# Patient Record
Sex: Male | Born: 1940 | Race: White | Hispanic: No | Marital: Married | Smoking: Former smoker
Health system: Southern US, Community
[De-identification: ages and names within clinical notes are randomized; demographics above are authoritative.]

## PROBLEM LIST (undated history)

## (undated) DIAGNOSIS — F0390 Unspecified dementia without behavioral disturbance: Secondary | ICD-10-CM

## (undated) HISTORY — PX: SKIN GRAFT: SHX250

## (undated) HISTORY — PX: OTHER SURGICAL HISTORY: SHX169

## (undated) HISTORY — PX: MOUTH SURGERY: SHX715

---

## 1999-10-24 ENCOUNTER — Encounter: Admission: RE | Admit: 1999-10-24 | Discharge: 1999-10-24 | Payer: Self-pay | Admitting: *Deleted

## 1999-10-24 ENCOUNTER — Encounter: Payer: Self-pay | Admitting: *Deleted

## 2001-07-23 ENCOUNTER — Ambulatory Visit (HOSPITAL_COMMUNITY): Admission: RE | Admit: 2001-07-23 | Discharge: 2001-07-23 | Payer: Self-pay | Admitting: Gastroenterology

## 2001-07-23 ENCOUNTER — Encounter: Payer: Self-pay | Admitting: Gastroenterology

## 2015-11-14 ENCOUNTER — Encounter: Payer: Self-pay | Admitting: Nurse Practitioner

## 2016-02-22 ENCOUNTER — Encounter: Payer: Self-pay | Admitting: Family Medicine

## 2016-02-22 ENCOUNTER — Ambulatory Visit (INDEPENDENT_AMBULATORY_CARE_PROVIDER_SITE_OTHER): Payer: Medicare Other | Admitting: Family Medicine

## 2016-02-22 VITALS — BP 144/100 | HR 68 | Temp 98.3°F | Resp 16 | Ht 70.0 in | Wt 228.0 lb

## 2016-02-22 DIAGNOSIS — R413 Other amnesia: Secondary | ICD-10-CM | POA: Diagnosis not present

## 2016-02-22 DIAGNOSIS — R4701 Aphasia: Secondary | ICD-10-CM

## 2016-02-22 LAB — COMPLETE METABOLIC PANEL WITH GFR
ALT: 13 U/L (ref 9–46)
AST: 14 U/L (ref 10–35)
Albumin: 4.1 g/dL (ref 3.6–5.1)
Alkaline Phosphatase: 56 U/L (ref 40–115)
BUN: 21 mg/dL (ref 7–25)
CO2: 24 mmol/L (ref 20–31)
Calcium: 8.7 mg/dL (ref 8.6–10.3)
Chloride: 105 mmol/L (ref 98–110)
Creat: 1.47 mg/dL — ABNORMAL HIGH (ref 0.70–1.18)
GFR, Est African American: 53 mL/min — ABNORMAL LOW (ref >=60)
GFR, Est Non African American: 46 mL/min — ABNORMAL LOW (ref >=60)
Glucose, Bld: 96 mg/dL (ref 70–99)
Potassium: 4.4 mmol/L (ref 3.5–5.3)
Sodium: 141 mmol/L (ref 135–146)
Total Bilirubin: 0.5 mg/dL (ref 0.2–1.2)
Total Protein: 7.3 g/dL (ref 6.1–8.1)

## 2016-02-22 LAB — CBC WITH DIFFERENTIAL/PLATELET
Basophils Absolute: 65 cells/uL (ref 0–200)
Basophils Relative: 1 %
Eosinophils Absolute: 325 cells/uL (ref 15–500)
Eosinophils Relative: 5 %
HCT: 44.9 % (ref 38.5–50.0)
Hemoglobin: 15.2 g/dL (ref 13.0–17.0)
Lymphocytes Relative: 29 %
Lymphs Abs: 1885 cells/uL (ref 850–3900)
MCH: 29.5 pg (ref 27.0–33.0)
MCHC: 33.9 g/dL (ref 32.0–36.0)
MCV: 87.2 fL (ref 80.0–100.0)
MPV: 10.1 fL (ref 7.5–12.5)
Monocytes Absolute: 780 cells/uL (ref 200–950)
Monocytes Relative: 12 %
Neutro Abs: 3445 cells/uL (ref 1500–7800)
Neutrophils Relative %: 53 %
Platelets: 230 10*3/uL (ref 140–400)
RBC: 5.15 MIL/uL (ref 4.20–5.80)
RDW: 14.2 % (ref 11.0–15.0)
WBC: 6.5 10*3/uL (ref 3.8–10.8)

## 2016-02-22 LAB — LIPID PANEL
CHOL/HDL RATIO: 6.2 ratio — AB (ref <=5.0)
Cholesterol: 193 mg/dL (ref 125–200)
HDL: 31 mg/dL — AB (ref >=40)
LDL CALC: 107 mg/dL (ref <130)
TRIGLYCERIDES: 273 mg/dL — AB (ref <150)
VLDL: 55 mg/dL — AB (ref <30)

## 2016-02-22 LAB — VITAMIN B12: Vitamin B-12: 433 pg/mL (ref 200–1100)

## 2016-02-22 LAB — TSH: TSH: 1.85 m[IU]/L (ref 0.40–4.50)

## 2016-02-22 MED ORDER — DONEPEZIL HCL 10 MG PO TABS: 10.0000 mg | ORAL_TABLET | Freq: Every day | ORAL | 5 refills | Status: DC

## 2016-02-22 MED ORDER — MEMANTINE HCL 10 MG PO TABS: 10.0000 mg | ORAL_TABLET | Freq: Two times a day (BID) | ORAL | 5 refills | Status: DC

## 2016-02-22 NOTE — Progress Notes (Signed)
Subjective:    Patient ID: Kyle Watkins, male    DOB: 04-11-1941, 75 y.o.   MRN: [id number]  HPI Patient is here today to establish care. He is accompanied by his wife. He is been diagnosed with dementia. They would like a second opinion. Wife states the symptoms began earlier this year rather suddenly. Since that time, she has noticed word finding aphasia. He has no problems with long-term memory. He is having some problems with short-term memory and recall. I performed a Mini-Mental status exam today. He is unable to tell me the month, the day of the week, or the year. He is able to tell me the location however. He remembers 2 out of 3 objects on recall. He is unable to perform serial sevens or spell world in reverse. On mini cog/clock drawing, the patient is unable to draw a clock with any numbers on the face. Furthermore he forgets to draw one of the hands on the face. However when I draw a clock he is able to tell me the appropriate time. There is no evidence of neurologic deficit on his neurologic exam No past medical history on file. No past surgical history on file. No current outpatient prescriptions on file prior to visit.   No current facility-administered medications on file prior to visit.    Allergies  Allergen Reactions  . Keflex [Cephalexin] Itching and Rash   Social History   Social History  . Marital status: Married    Spouse name: N/A  . Number of children: N/A  . Years of education: N/A   Occupational History  . Not on file.   Social History Main Topics  . Smoking status: Former Smoker    Types: Cigarettes    Quit date: 02/22/1959  . Smokeless tobacco: Never Used  . Alcohol use No  . Drug use: No  . Sexual activity: Not on file   Other Topics Concern  . Not on file   Social History Narrative  . No narrative on file   No family history on file.    Review of Systems  All other systems reviewed and are negative.      Objective:   Physical  Exam  Constitutional: He is oriented to person, place, and time. He appears well-developed and well-nourished. No distress.  HENT:  Head: Normocephalic and atraumatic.  Right Ear: External ear normal.  Left Ear: External ear normal.  Nose: Nose normal.  Mouth/Throat: Oropharynx is clear and moist. No oropharyngeal exudate.  Eyes: Conjunctivae and EOM are normal. Pupils are equal, round, and reactive to light. Right eye exhibits no discharge. Left eye exhibits no discharge. No scleral icterus.  Neck: Normal range of motion. Neck supple. No JVD present. No tracheal deviation present. No thyromegaly present.  Cardiovascular: Normal rate, regular rhythm, normal heart sounds and intact distal pulses.  Exam reveals no gallop and no friction rub.   No murmur heard. Pulmonary/Chest: Effort normal and breath sounds normal. No stridor. No respiratory distress. He has no wheezes. He has no rales. He exhibits no tenderness.  Abdominal: Soft. Bowel sounds are normal. He exhibits no distension and no mass. There is no tenderness. There is no rebound and no guarding.  Musculoskeletal: Normal range of motion. He exhibits no edema, tenderness or deformity.  Lymphadenopathy:    He has no cervical adenopathy.  Neurological: He is alert and oriented to person, place, and time. He has normal reflexes. He displays normal reflexes. No cranial nerve deficit. He exhibits  normal muscle tone. Coordination normal.  Skin: He is not diaphoretic.  Psychiatric: He has a normal mood and affect. His behavior is normal. Judgment and thought content normal. His speech is delayed. Cognition and memory are impaired. He exhibits abnormal recent memory. He exhibits normal remote memory.  Vitals reviewed.         Assessment & Plan:  Memory loss - Plan: CBC with Differential/Platelet, COMPLETE METABOLIC PANEL WITH GFR, Lipid panel, TSH, Vitamin B12, MR Brain W Wo Contrast  Aphasia - Plan: MR Brain W Wo Contrast  Based on the  patient's exam, I believe he has dementia. I will perform an MRI of the brain given his a fascia to evaluate for possible left temporal lobe stroke. I will also check a CBC, CMP, fasting lipid panel, TSH, and a vitamin B12 to complete the workup for memory problems. If MRI and lab work is within normal limits, I will continue the patient on his Aricept and Namenda.

## 2016-02-26 ENCOUNTER — Other Ambulatory Visit: Payer: Self-pay | Admitting: Family Medicine

## 2016-02-26 DIAGNOSIS — T1590XA Foreign body on external eye, part unspecified, unspecified eye, initial encounter: Secondary | ICD-10-CM

## 2016-03-07 ENCOUNTER — Inpatient Hospital Stay: Admission: RE | Admit: 2016-03-07 | Payer: Self-pay | Source: Ambulatory Visit

## 2016-03-07 ENCOUNTER — Ambulatory Visit
Admission: RE | Admit: 2016-03-07 | Discharge: 2016-03-07 | Disposition: A | Discharge status: Home / Self Care | Payer: Medicare Other | Source: Ambulatory Visit | Attending: Family Medicine | Admitting: Family Medicine

## 2016-03-07 ENCOUNTER — Other Ambulatory Visit: Payer: Self-pay

## 2016-03-07 DIAGNOSIS — R413 Other amnesia: Secondary | ICD-10-CM

## 2016-03-07 DIAGNOSIS — T1590XA Foreign body on external eye, part unspecified, unspecified eye, initial encounter: Secondary | ICD-10-CM

## 2016-03-07 DIAGNOSIS — R4701 Aphasia: Secondary | ICD-10-CM

## 2016-03-07 MED ORDER — GADOBENATE DIMEGLUMINE 529 MG/ML IV SOLN
20.0000 mL | Freq: Once | INTRAVENOUS | Status: AC | PRN
  Administered 2016-03-07: 20 mL via INTRAVENOUS

## 2016-08-08 ENCOUNTER — Encounter: Payer: Self-pay | Admitting: Family Medicine

## 2016-08-08 ENCOUNTER — Ambulatory Visit (INDEPENDENT_AMBULATORY_CARE_PROVIDER_SITE_OTHER): Payer: Medicare HMO | Admitting: Family Medicine

## 2016-08-08 VITALS — BP 126/82 | HR 78 | Temp 98.9°F | Resp 16 | Wt 221.0 lb

## 2016-08-08 DIAGNOSIS — N183 Chronic kidney disease, stage 3 unspecified: Secondary | ICD-10-CM

## 2016-08-08 DIAGNOSIS — G300 Alzheimer's disease with early onset: Secondary | ICD-10-CM

## 2016-08-08 DIAGNOSIS — F0280 Dementia in other diseases classified elsewhere without behavioral disturbance: Secondary | ICD-10-CM

## 2016-08-08 DIAGNOSIS — R4701 Aphasia: Secondary | ICD-10-CM

## 2016-08-08 DIAGNOSIS — Z23 Encounter for immunization: Secondary | ICD-10-CM

## 2016-08-08 DIAGNOSIS — R69 Illness, unspecified: Secondary | ICD-10-CM | POA: Diagnosis not present

## 2016-08-08 LAB — CBC WITH DIFFERENTIAL/PLATELET
BASOS ABS: 0 {cells}/uL (ref 0–200)
Basophils Relative: 0 %
EOS PCT: 3 %
Eosinophils Absolute: 294 cells/uL (ref 15–500)
HCT: 42.4 % (ref 38.5–50.0)
Hemoglobin: 14.3 g/dL (ref 13.0–17.0)
LYMPHS PCT: 18 %
Lymphs Abs: 1764 cells/uL (ref 850–3900)
MCH: 29.8 pg (ref 27.0–33.0)
MCHC: 33.7 g/dL (ref 32.0–36.0)
MCV: 88.3 fL (ref 80.0–100.0)
MONOS PCT: 9 %
MPV: 10.1 fL (ref 7.5–12.5)
Monocytes Absolute: 882 cells/uL (ref 200–950)
NEUTROS PCT: 70 %
Neutro Abs: 6860 cells/uL (ref 1500–7800)
PLATELETS: 274 10*3/uL (ref 140–400)
RBC: 4.8 MIL/uL (ref 4.20–5.80)
RDW: 13.6 % (ref 11.0–15.0)
WBC: 9.8 10*3/uL (ref 3.8–10.8)

## 2016-08-08 LAB — COMPLETE METABOLIC PANEL WITH GFR
ALT: 10 U/L (ref 9–46)
AST: 12 U/L (ref 10–35)
Albumin: 3.8 g/dL (ref 3.6–5.1)
Alkaline Phosphatase: 50 U/L (ref 40–115)
BUN: 23 mg/dL (ref 7–25)
CO2: 26 mmol/L (ref 20–31)
Calcium: 9 mg/dL (ref 8.6–10.3)
Chloride: 106 mmol/L (ref 98–110)
Creat: 1.49 mg/dL — ABNORMAL HIGH (ref 0.70–1.18)
GFR, EST AFRICAN AMERICAN: 52 mL/min — AB (ref >=60)
GFR, EST NON AFRICAN AMERICAN: 45 mL/min — AB (ref >=60)
GLUCOSE: 92 mg/dL (ref 70–99)
POTASSIUM: 4.6 mmol/L (ref 3.5–5.3)
SODIUM: 141 mmol/L (ref 135–146)
Total Bilirubin: 0.4 mg/dL (ref 0.2–1.2)
Total Protein: 7.2 g/dL (ref 6.1–8.1)

## 2016-08-08 NOTE — Progress Notes (Signed)
Subjective:    Patient ID: Kyle Watkins, male    DOB: Jul 19, 1941, 76 y.o.   MRN: [id number]  HPI  02/22/16 Patient is here today to establish care. He is accompanied by his wife. He is been diagnosed with dementia. They would like a second opinion. Wife states the symptoms began earlier this year rather suddenly. Since that time, she has noticed word finding aphasia. He has no problems with long-term memory. He is having some problems with short-term memory and recall. I performed a Mini-Mental status exam today. He is unable to tell me the month, the day of the week, or the year. He is able to tell me the location however. He remembers 2 out of 3 objects on recall. He is unable to perform serial sevens or spell world in reverse. On mini cog/clock drawing, the patient is unable to draw a clock with any numbers on the face. Furthermore he forgets to draw one of the hands on the face. However when I draw a clock he is able to tell me the appropriate time. There is no evidence of neurologic deficit on his neurologic exam.  At that time, my plan was: Based on the patient's exam, I believe he has dementia. I will perform an MRI of the brain given his a fascia to evaluate for possible left temporal lobe stroke. I will also check a CBC, CMP, fasting lipid panel, TSH, and a vitamin B12 to complete the workup for memory problems. If MRI and lab work is within normal limits, I will continue the patient on his Aricept and Namenda.  08/08/16 Patient is here today for follow-up. MRI revealed minimal microvascular ischemic change. Vascular dementia was essentially ruled out making the most likely diagnosis Alzheimer's disease. Lab work was only significant for chronic kidney disease with creatinine of 1.47. The patient was started on Aricept and Namenda. Overall he's done well on the medication. He denies any dizziness or bradycardia or syncope. He denies any stomach upset. Daughter states that he was having some  vivid dreams when he first started the medication however that seems to have improved. At the present time his dementia seems stable. He has lost 7 pounds although his daughter states that his appetite is good. He is due for a flu shot today. He is also due for Pneumovax 23 as well as lab work to monitor his chronic kidney disease. Otherwise his review of systems is negative No past medical history on file. No past surgical history on file. Current Outpatient Prescriptions on File Prior to Visit  Medication Sig Dispense Refill  . aspirin EC 81 MG tablet Take 81 mg by mouth daily.    . cholecalciferol (VITAMIN D) 1000 units tablet Take 1,000 Units by mouth daily.    Marland Kitchen donepezil (ARICEPT) 10 MG tablet Take 1 tablet (10 mg total) by mouth at bedtime. 30 tablet 5  . memantine (NAMENDA) 10 MG tablet Take 1 tablet (10 mg total) by mouth 2 (two) times daily. 60 tablet 5   No current facility-administered medications on file prior to visit.    Allergies  Allergen Reactions  . Keflex [Cephalexin] Itching and Rash   Social History   Social History  . Marital status: Married    Spouse name: N/A  . Number of children: N/A  . Years of education: N/A   Occupational History  . Not on file.   Social History Main Topics  . Smoking status: Former Smoker    Types: Cigarettes  Quit date: 02/22/1959  . Smokeless tobacco: Never Used  . Alcohol use No  . Drug use: No  . Sexual activity: Not on file   Other Topics Concern  . Not on file   Social History Narrative  . No narrative on file   No family history on file.    Review of Systems  All other systems reviewed and are negative.      Objective:   Physical Exam  Constitutional: He is oriented to person, place, and time. He appears well-developed and well-nourished. No distress.  HENT:  Head: Normocephalic and atraumatic.  Right Ear: External ear normal.  Left Ear: External ear normal.  Nose: Nose normal.  Mouth/Throat: Oropharynx  is clear and moist. No oropharyngeal exudate.  Eyes: Conjunctivae and EOM are normal. Pupils are equal, round, and reactive to light. Right eye exhibits no discharge. Left eye exhibits no discharge. No scleral icterus.  Neck: Normal range of motion. Neck supple. No JVD present. No tracheal deviation present. No thyromegaly present.  Cardiovascular: Normal rate, regular rhythm, normal heart sounds and intact distal pulses.  Exam reveals no gallop and no friction rub.   No murmur heard. Pulmonary/Chest: Effort normal and breath sounds normal. No stridor. No respiratory distress. He has no wheezes. He has no rales. He exhibits no tenderness.  Abdominal: Soft. Bowel sounds are normal. He exhibits no distension and no mass. There is no tenderness. There is no rebound and no guarding.  Musculoskeletal: Normal range of motion. He exhibits no edema, tenderness or deformity.  Lymphadenopathy:    He has no cervical adenopathy.  Neurological: He is alert and oriented to person, place, and time. He has normal reflexes. No cranial nerve deficit. He exhibits normal muscle tone. Coordination normal.  Skin: He is not diaphoretic.  Psychiatric: He has a normal mood and affect. His behavior is normal. Judgment and thought content normal. His speech is delayed. Cognition and memory are impaired. He exhibits abnormal recent memory. He exhibits normal remote memory.  Vitals reviewed.         Assessment & Plan:  Early onset Alzheimer's dementia without behavioral disturbance - Plan: CBC with Differential/Platelet, COMPLETE METABOLIC PANEL WITH GFR  Aphasia  CKD (chronic kidney disease), stage III Continue a combination of Aricept and Namenda. At the present time the patient is stable. We did to watch his appetite closely. He continues to lose weight we may want to start him on ensure. I recommended regular exercise in any activities to try to keep his mind active to delay progression. He will receive the flu  shot as well as Pneumovax 23. I will check a CMP to monitor his renal function given his chronic kidney disease as well as a CBC to monitor for anemia. Otherwise we'll make no changes at this time

## 2016-08-08 NOTE — Addendum Note (Signed)
Addended by: Shary Decamp B on: 08/08/2016 03:36 PM   Modules accepted: Orders

## 2016-08-12 ENCOUNTER — Other Ambulatory Visit: Payer: Self-pay | Admitting: Family Medicine

## 2016-08-26 ENCOUNTER — Other Ambulatory Visit: Payer: Self-pay | Admitting: Family Medicine

## 2016-09-05 DIAGNOSIS — L570 Actinic keratosis: Secondary | ICD-10-CM | POA: Diagnosis not present

## 2016-09-05 DIAGNOSIS — L821 Other seborrheic keratosis: Secondary | ICD-10-CM | POA: Diagnosis not present

## 2016-09-16 ENCOUNTER — Other Ambulatory Visit: Payer: Self-pay | Admitting: Family Medicine

## 2016-10-14 DIAGNOSIS — H40023 Open angle with borderline findings, high risk, bilateral: Secondary | ICD-10-CM | POA: Diagnosis not present

## 2016-10-14 DIAGNOSIS — H02403 Unspecified ptosis of bilateral eyelids: Secondary | ICD-10-CM | POA: Diagnosis not present

## 2016-10-14 DIAGNOSIS — H04123 Dry eye syndrome of bilateral lacrimal glands: Secondary | ICD-10-CM | POA: Diagnosis not present

## 2016-11-20 DIAGNOSIS — H40023 Open angle with borderline findings, high risk, bilateral: Secondary | ICD-10-CM | POA: Diagnosis not present

## 2017-05-02 ENCOUNTER — Encounter (HOSPITAL_COMMUNITY): Payer: Self-pay

## 2017-05-02 ENCOUNTER — Emergency Department (HOSPITAL_COMMUNITY): Payer: Medicare HMO

## 2017-05-02 ENCOUNTER — Emergency Department (HOSPITAL_COMMUNITY)
Admission: EM | Admit: 2017-05-02 | Discharge: 2017-05-02 | Disposition: A | Discharge status: Home / Self Care | Payer: Medicare HMO | Attending: Emergency Medicine | Admitting: Emergency Medicine

## 2017-05-02 DIAGNOSIS — R079 Chest pain, unspecified: Secondary | ICD-10-CM | POA: Diagnosis not present

## 2017-05-02 DIAGNOSIS — R69 Illness, unspecified: Secondary | ICD-10-CM | POA: Diagnosis not present

## 2017-05-02 DIAGNOSIS — R072 Precordial pain: Secondary | ICD-10-CM | POA: Diagnosis not present

## 2017-05-02 DIAGNOSIS — F0390 Unspecified dementia without behavioral disturbance: Secondary | ICD-10-CM | POA: Diagnosis not present

## 2017-05-02 DIAGNOSIS — Z79899 Other long term (current) drug therapy: Secondary | ICD-10-CM | POA: Diagnosis not present

## 2017-05-02 DIAGNOSIS — Z87891 Personal history of nicotine dependence: Secondary | ICD-10-CM | POA: Diagnosis not present

## 2017-05-02 DIAGNOSIS — Z7982 Long term (current) use of aspirin: Secondary | ICD-10-CM | POA: Insufficient documentation

## 2017-05-02 DIAGNOSIS — R0602 Shortness of breath: Secondary | ICD-10-CM | POA: Diagnosis not present

## 2017-05-02 HISTORY — DX: Unspecified dementia, unspecified severity, without behavioral disturbance, psychotic disturbance, mood disturbance, and anxiety: F03.90

## 2017-05-02 LAB — CBC
HEMATOCRIT: 46 % (ref 39.0–52.0)
HEMOGLOBIN: 15.8 g/dL (ref 13.0–17.0)
MCH: 29.6 pg (ref 26.0–34.0)
MCHC: 34.3 g/dL (ref 30.0–36.0)
MCV: 86.1 fL (ref 78.0–100.0)
Platelets: 282 10*3/uL (ref 150–400)
RBC: 5.34 MIL/uL (ref 4.22–5.81)
RDW: 13.2 % (ref 11.5–15.5)
WBC: 10.3 10*3/uL (ref 4.0–10.5)

## 2017-05-02 LAB — BASIC METABOLIC PANEL
ANION GAP: 8 (ref 5–15)
BUN: 20 mg/dL (ref 6–20)
CHLORIDE: 106 mmol/L (ref 101–111)
CO2: 25 mmol/L (ref 22–32)
Calcium: 9.2 mg/dL (ref 8.9–10.3)
Creatinine, Ser: 1.4 mg/dL — ABNORMAL HIGH (ref 0.61–1.24)
GFR calc Af Amer: 54 mL/min — ABNORMAL LOW (ref >60)
GFR calc non Af Amer: 47 mL/min — ABNORMAL LOW (ref >60)
GLUCOSE: 122 mg/dL — AB (ref 65–99)
POTASSIUM: 3.8 mmol/L (ref 3.5–5.1)
Sodium: 139 mmol/L (ref 135–145)

## 2017-05-02 LAB — I-STAT TROPONIN, ED: Troponin i, poc: 0 ng/mL (ref 0.00–0.08)

## 2017-05-02 NOTE — ED Notes (Signed)
Pt provided discharge instructions, no questions or concerns at this time. E-signature not working, unable to [employer].

## 2017-05-02 NOTE — ED Triage Notes (Signed)
Per Pt and Family, Pt is coming from home with complaints of chest pain that started about thirty minutes ago and subsided. Pt has hx of dementia and wife reports that patient came and found him complaining of chest pain and SOB. BP was elevated when taken at home along with pt being pale. Pt denies CP at this time and wife reports color is back to normal.

## 2017-05-02 NOTE — Discharge Instructions (Signed)
It was our pleasure to provide your ER care today - we hope that you feel better.  Continue to take an aspirin a day.   Follow up with cardiologist in the next 1-2 weeks.  Return to ER if worse, persistent or recurrent chest pain, trouble breathing, other concern.

## 2017-05-02 NOTE — ED Provider Notes (Signed)
[facility] DEPT Provider Note   CSN: [id number] Arrival date & time: 05/02/17  1156     History   Chief Complaint Chief Complaint  Patient presents with  . Chest Pain    HPI Kyle Watkins is a 76 y.o. male.  Patient w hx dementia, had c/o chest pain earlier today.   Pt/family indicate it occurred after walking out/?to trash - they indicate at pts baseline he is very sedentary.  Lasted several minutes. In ED, chest discomfort has completely resolved, and has remained resolved for the past 4+ hours. No other recent cp or discomfort. Denies sob. No nv. No diaphoresis. Denies hx cad. States prior tests w cardiologist a year or two ago were good/normal. No cough or uri c/o. No fever or chills.   The history is provided by the patient, the spouse and a relative.  Chest Pain   Pertinent negatives include no abdominal pain, no back pain, no fever, no headaches and no shortness of breath.    Past Medical History:  Diagnosis Date  . Dementia     There are no active problems to display for this patient.   Past Surgical History:  Procedure Laterality Date  . Arm surgery     . MOUTH SURGERY    . SKIN GRAFT         Home Medications    Prior to Admission medications   Medication Sig Start Date End Date Taking? Authorizing Provider  aspirin EC 81 MG tablet Take 81 mg by mouth daily.    [provider]  cholecalciferol (VITAMIN D) 1000 units tablet Take 1,000 Units by mouth daily.    [provider]  donepezil (ARICEPT) 10 MG tablet TAKE 1 TABLET BY MOUTH DAILY AT BEDTIME 08/26/16   Susy Frizzle, MD  meloxicam (MOBIC) 15 MG tablet TAKE 1 TABLET BY MOUTH ONCE A DAY 08/12/16   Susy Frizzle, MD  memantine (NAMENDA) 10 MG tablet TAKE 1 TABLET BY MOUTH TWICE A DAY 09/16/16   Susy Frizzle, MD    Family History No family history on file.  Social History Social History  Substance Use Topics  . Smoking status: Former Smoker    Types:  Cigarettes    Quit date: 02/22/1959  . Smokeless tobacco: Never Used  . Alcohol use No     Allergies   Keflex [cephalexin]   Review of Systems Review of Systems  Constitutional: Negative for fever.  HENT: Negative for sore throat.   Eyes: Negative for redness.  Respiratory: Negative for shortness of breath.   Cardiovascular: Positive for chest pain. Negative for leg swelling.  Gastrointestinal: Negative for abdominal pain.  Genitourinary: Negative for flank pain.  Musculoskeletal: Negative for back pain.  Skin: Negative for rash.  Neurological: Negative for headaches.  Hematological: Does not bruise/bleed easily.  Psychiatric/Behavioral: Negative for confusion.     Physical Exam Updated Vital Signs BP (!) 146/79 (BP Location: Right Arm)   Pulse 77   Temp 98.5 F (36.9 C) (Oral)   Resp 18   Ht 1.803 m (5\' 11" )   Wt 102.1 kg (225 lb)   SpO2 95%   BMI 31.38 kg/m   Physical Exam  Constitutional: He appears well-developed and well-nourished. No distress.  HENT:  Mouth/Throat: Oropharynx is clear and moist.  Eyes: Conjunctivae are normal.  Neck: Neck supple. No tracheal deviation present.  Cardiovascular: Normal rate, regular rhythm, normal heart sounds and intact distal pulses.  Exam reveals no friction rub.  No murmur heard. Pulmonary/Chest: Effort normal and breath sounds normal. No accessory muscle usage. No respiratory distress. He exhibits no tenderness.  Abdominal: Soft. Bowel sounds are normal. He exhibits no distension. There is no tenderness.  Musculoskeletal: He exhibits no edema or tenderness.  Neurological: He is alert.  Skin: Skin is warm and dry. No rash noted. He is not diaphoretic.  Psychiatric: He has a normal mood and affect.  Nursing note and vitals reviewed.    ED Treatments / Results  Labs (all labs ordered are listed, but only abnormal results are displayed)  Results for orders placed or performed during the hospital encounter of  96/29/52  Basic metabolic panel  Result Value Ref Range   Sodium 139 135 - 145 mmol/L   Potassium 3.8 3.5 - 5.1 mmol/L   Chloride 106 101 - 111 mmol/L   CO2 25 22 - 32 mmol/L   Glucose, Bld 122 (H) 65 - 99 mg/dL   BUN 20 6 - 20 mg/dL   Creatinine, Ser 1.40 (H) 0.61 - 1.24 mg/dL   Calcium 9.2 8.9 - 10.3 mg/dL   GFR calc non Af Amer 47 (L) >60 mL/min   GFR calc Af Amer 54 (L) >60 mL/min   Anion gap 8 5 - 15  CBC  Result Value Ref Range   WBC 10.3 4.0 - 10.5 K/uL   RBC 5.34 4.22 - 5.81 MIL/uL   Hemoglobin 15.8 13.0 - 17.0 g/dL   HCT 46.0 39.0 - 52.0 %   MCV 86.1 78.0 - 100.0 fL   MCH 29.6 26.0 - 34.0 pg   MCHC 34.3 30.0 - 36.0 g/dL   RDW 13.2 11.5 - 15.5 %   Platelets 282 150 - 400 K/uL  I-stat troponin, ED  Result Value Ref Range   Troponin i, poc 0.00 0.00 - 0.08 ng/mL   Comment 3           Dg Chest 2 View  Result Date: 05/02/2017 CLINICAL DATA:  Chest pain and shortness of breath EXAM: CHEST  2 VIEW COMPARISON:  None. FINDINGS: Lungs are clear. Heart size and pulmonary vascularity are normal. No adenopathy. No bone lesions evident. No pneumothorax. There is aortic atherosclerosis. IMPRESSION: No edema or consolidation.  There is aortic atherosclerosis. Aortic Atherosclerosis (ICD10-I70.0). Electronically Signed   By: Lowella Grip III M.D.   On: 05/02/2017 12:26    EKG  EKG Interpretation  Date/Time:  Friday May 02 2017 12:02:30 EDT Ventricular Rate:  74 PR Interval:  178 QRS Duration: 78 QT Interval:  358 QTC Calculation: 397 R Axis:   -26 Text Interpretation:  Sinus rhythm with marked sinus arrhythmia with occasional Premature ventricular complexes Confirmed by Lajean Saver [id number]) on 05/02/2017 3:38:47 PM       Radiology Dg Chest 2 View  Result Date: 05/02/2017 CLINICAL DATA:  Chest pain and shortness of breath EXAM: CHEST  2 VIEW COMPARISON:  None. FINDINGS: Lungs are clear. Heart size and pulmonary vascularity are normal. No adenopathy. No bone  lesions evident. No pneumothorax. There is aortic atherosclerosis. IMPRESSION: No edema or consolidation.  There is aortic atherosclerosis. Aortic Atherosclerosis (ICD10-I70.0). Electronically Signed   By: Lowella Grip III M.D.   On: 05/02/2017 12:26    Procedures Procedures (including critical care time)  Medications Ordered in ED Medications - No data to display   Initial Impression / Assessment and Plan / ED Course  I have reviewed the triage vital signs and the nursing notes.  Pertinent labs & imaging  results that were available during my care of the patient were reviewed by me and considered in my medical decision making (see chart for details).  Iv ns. Continuous pulse ox and monitor.   Labs. Ecg. Cxr.  Reviewed nursing notes and prior charts for additional history.   Patient remains symptom free throughout ED stay. Trop is 0. cxr neg acute.  Patient currently appears stable for d/c.     Final Clinical Impressions(s) / ED Diagnoses   Final diagnoses:  Precordial chest pain    New Prescriptions New Prescriptions   No medications on file     Lajean Saver, MD 05/02/17 [phone]

## 2017-07-30 ENCOUNTER — Other Ambulatory Visit: Payer: Self-pay | Admitting: Family Medicine

## 2017-09-08 ENCOUNTER — Ambulatory Visit (INDEPENDENT_AMBULATORY_CARE_PROVIDER_SITE_OTHER): Payer: Medicare HMO | Admitting: Family Medicine

## 2017-09-08 ENCOUNTER — Encounter: Payer: Self-pay | Admitting: Family Medicine

## 2017-09-08 VITALS — BP 134/90 | HR 84 | Temp 98.1°F | Resp 14 | Ht 70.0 in | Wt 220.0 lb

## 2017-09-08 DIAGNOSIS — R7309 Other abnormal glucose: Secondary | ICD-10-CM | POA: Diagnosis not present

## 2017-09-08 DIAGNOSIS — Z0000 Encounter for general adult medical examination without abnormal findings: Secondary | ICD-10-CM | POA: Diagnosis not present

## 2017-09-08 DIAGNOSIS — N183 Chronic kidney disease, stage 3 unspecified: Secondary | ICD-10-CM

## 2017-09-08 DIAGNOSIS — Z23 Encounter for immunization: Secondary | ICD-10-CM | POA: Diagnosis not present

## 2017-09-08 DIAGNOSIS — F0280 Dementia in other diseases classified elsewhere without behavioral disturbance: Secondary | ICD-10-CM

## 2017-09-08 DIAGNOSIS — R69 Illness, unspecified: Secondary | ICD-10-CM | POA: Diagnosis not present

## 2017-09-08 DIAGNOSIS — G300 Alzheimer's disease with early onset: Secondary | ICD-10-CM | POA: Diagnosis not present

## 2017-09-08 DIAGNOSIS — R4701 Aphasia: Secondary | ICD-10-CM | POA: Diagnosis not present

## 2017-09-08 DIAGNOSIS — R413 Other amnesia: Secondary | ICD-10-CM | POA: Diagnosis not present

## 2017-09-08 MED ORDER — DICLOFENAC SODIUM 1 % TD GEL: 2.0000 g | Freq: Four times a day (QID) | TRANSDERMAL | 3 refills | Status: DC

## 2017-09-08 NOTE — Addendum Note (Signed)
Addended by: Shary Decamp B on: 09/08/2017 04:06 PM   Modules accepted: Orders

## 2017-09-08 NOTE — Progress Notes (Signed)
Subjective:    Patient ID: Kyle Watkins, male    DOB: 1941/06/27, 77 y.o.   MRN: [id number]  Medication Refill     02/22/16 Patient is here today to establish care. He is accompanied by his wife. He is been diagnosed with dementia. They would like a second opinion. Wife states the symptoms began earlier this year rather suddenly. Since that time, she has noticed word finding aphasia. He has no problems with long-term memory. He is having some problems with short-term memory and recall. I performed a Mini-Mental status exam today. He is unable to tell me the month, the day of the week, or the year. He is able to tell me the location however. He remembers 2 out of 3 objects on recall. He is unable to perform serial sevens or spell world in reverse. On mini cog/clock drawing, the patient is unable to draw a clock with any numbers on the face. Furthermore he forgets to draw one of the hands on the face. However when I draw a clock he is able to tell me the appropriate time. There is no evidence of neurologic deficit on his neurologic exam.  At that time, my plan was: Based on the patient's exam, I believe he has dementia. I will perform an MRI of the brain given his a fascia to evaluate for possible left temporal lobe stroke. I will also check a CBC, CMP, fasting lipid panel, TSH, and a vitamin B12 to complete the workup for memory problems. If MRI and lab work is within normal limits, I will continue the patient on his Aricept and Namenda.  08/08/16 Patient is here today for follow-up. MRI revealed minimal microvascular ischemic change. Vascular dementia was essentially ruled out making the most likely diagnosis Alzheimer's disease. Lab work was only significant for chronic kidney disease with creatinine of 1.47. The patient was started on Aricept and Namenda. Overall he's done well on the medication. He denies any dizziness or bradycardia or syncope. He denies any stomach upset. Daughter states that  he was having some vivid dreams when he first started the medication however that seems to have improved. At the present time his dementia seems stable. He has lost 7 pounds although his daughter states that his appetite is good. He is due for a flu shot today. He is also due for Pneumovax 23 as well as lab work to monitor his chronic kidney disease. Otherwise his review of systems is negative.  At that time, my plan was: Continue a combination of Aricept and Namenda. At the present time the patient is stable. We did to watch his appetite closely. He continues to lose weight we may want to start him on ensure. I recommended regular exercise in any activities to try to keep his mind active to delay progression. He will receive the flu shot as well as Pneumovax 23. I will check a CMP to monitor his renal function given his chronic kidney disease as well as a CBC to monitor for anemia. Otherwise we'll make no changes at this time  09/08/17 Due to age and dementia, patient is not appropriate for colonoscopy or prostate cancer screening.  Immunization records are listed below: Immunization History  Administered Date(s) Administered  . Influenza,inj,Quad PF,6+ Mos 08/08/2016  . Pneumococcal Polysaccharide-23 08/08/2016   He appears to be due for prevnar 13, annual flu vaccine, and shingrix.  Wife sees very little benefit from a combination of Aricept and Namenda.  Fortunately they are not causing the  patient any side effects.  Although he still remains relatively a phasic and speaks very seldom, he is still able to walk, bathe himself, go to the restroom, and dress himself.  Therefore I believe the medications are still beneficial in preservation.  He denies any other complaints.  He is not performing any IADLs.  His wife is in charge of all the finances and managing the household.  She also prepares the meals and helps run the farm.  He is able to perform his ADLs.  There is no evidence of depression.  She  denies any falling Past Medical History:  Diagnosis Date  . Dementia    Past Surgical History:  Procedure Laterality Date  . Arm surgery     . MOUTH SURGERY    . SKIN GRAFT     Current Outpatient Medications on File Prior to Visit  Medication Sig Dispense Refill  . aspirin EC 81 MG tablet Take 81 mg by mouth daily.    . cholecalciferol (VITAMIN D) 1000 units tablet Take 1,000 Units by mouth daily.    Marland Kitchen donepezil (ARICEPT) 10 MG tablet TAKE 1 TABLET BY MOUTH DAILY AT BEDTIME 30 tablet 1  . meloxicam (MOBIC) 15 MG tablet TAKE 1 TABLET BY MOUTH ONCE A DAY 90 tablet 1  . memantine (NAMENDA) 10 MG tablet TAKE 1 TABLET BY MOUTH TWICE A DAY 60 tablet 11   No current facility-administered medications on file prior to visit.    Allergies  Allergen Reactions  . Keflex [Cephalexin] Itching and Rash   Social History   Socioeconomic History  . Marital status: Married    Spouse name: Not on file  . Number of children: Not on file  . Years of education: Not on file  . Highest education level: Not on file  Social Needs  . Financial resource strain: Not on file  . Food insecurity - worry: Not on file  . Food insecurity - inability: Not on file  . Transportation needs - medical: Not on file  . Transportation needs - non-medical: Not on file  Occupational History  . Not on file  Tobacco Use  . Smoking status: Former Smoker    Types: Cigarettes    Last attempt to quit: 02/22/1959    Years since quitting: 58.5  . Smokeless tobacco: Never Used  Substance and Sexual Activity  . Alcohol use: No  . Drug use: No  . Sexual activity: Not on file  Other Topics Concern  . Not on file  Social History Narrative  . Not on file   No family history on file.    Review of Systems  All other systems reviewed and are negative.      Objective:   Physical Exam  Constitutional: He is oriented to person, place, and time. He appears well-developed and well-nourished. No distress.  HENT:    Head: Normocephalic and atraumatic.  Right Ear: External ear normal.  Left Ear: External ear normal.  Nose: Nose normal.  Mouth/Throat: Oropharynx is clear and moist. No oropharyngeal exudate.  Eyes: Conjunctivae and EOM are normal. Pupils are equal, round, and reactive to light. Right eye exhibits no discharge. Left eye exhibits no discharge. No scleral icterus.  Neck: Normal range of motion. Neck supple. No JVD present. No tracheal deviation present. No thyromegaly present.  Cardiovascular: Normal rate, regular rhythm, normal heart sounds and intact distal pulses. Exam reveals no gallop and no friction rub.  No murmur heard. Pulmonary/Chest: Effort normal and breath sounds normal.  No stridor. No respiratory distress. He has no wheezes. He has no rales. He exhibits no tenderness.  Abdominal: Soft. Bowel sounds are normal. He exhibits no distension and no mass. There is no tenderness. There is no rebound and no guarding.  Musculoskeletal: Normal range of motion. He exhibits no edema, tenderness or deformity.  Lymphadenopathy:    He has no cervical adenopathy.  Neurological: He is alert and oriented to person, place, and time. He has normal reflexes. No cranial nerve deficit. He exhibits normal muscle tone. Coordination normal.  Skin: He is not diaphoretic.  Psychiatric: He has a normal mood and affect. His behavior is normal. Judgment and thought content normal. His speech is delayed. Cognition and memory are impaired. He exhibits abnormal recent memory. He exhibits normal remote memory.  Vitals reviewed.         Assessment & Plan:   General medical exam - Plan: CBC with Differential/Platelet, COMPLETE METABOLIC PANEL WITH GFR, LDL Cholesterol, Direct  Early onset Alzheimer's dementia without behavioral disturbance - Plan: CBC with Differential/Platelet, COMPLETE METABOLIC PANEL WITH GFR, LDL Cholesterol, Direct  Aphasia  CKD (chronic kidney disease), stage III (HCC) - Plan: CBC  with Differential/Platelet, COMPLETE METABOLIC PANEL WITH GFR, LDL Cholesterol, Direct  Memory loss  Patient's exam is static compared to last year.  His memory loss appears stable.  There is no obvious progression.  He received his flu shot today.  He also received Prevnar 13.  I did not recommend any cancer screening.  I will check baseline lab work including a CBC, CMP.  Recommended activities to preserve the memory as much as possible such as reading, crossword puzzles, word searches, staying active in his community, talking with friends, visiting, going out to town, Social research officer, government.  Recommended against sedentary behaviors such as sitting and watching TV.  We will try Voltaren gel 2 g 4 times daily as needed for pains in the joints of his hand as well as in his knees.  Recommended against daily NSAID use due to chronic kidney disease.  Discontinue meloxicam

## 2017-09-10 ENCOUNTER — Telehealth: Payer: Self-pay | Admitting: Family Medicine

## 2017-09-10 LAB — CBC WITH DIFFERENTIAL/PLATELET
BASOS ABS: 38 {cells}/uL (ref 0–200)
Basophils Relative: 0.5 %
EOS ABS: 330 {cells}/uL (ref 15–500)
Eosinophils Relative: 4.4 %
HEMATOCRIT: 46.8 % (ref 38.5–50.0)
HEMOGLOBIN: 16.7 g/dL (ref 13.2–17.1)
LYMPHS ABS: 1875 {cells}/uL (ref 850–3900)
MCH: 30.1 pg (ref 27.0–33.0)
MCHC: 35.7 g/dL (ref 32.0–36.0)
MCV: 84.3 fL (ref 80.0–100.0)
MPV: 10.7 fL (ref 7.5–12.5)
Monocytes Relative: 9.3 %
Neutro Abs: 4560 cells/uL (ref 1500–7800)
Neutrophils Relative %: 60.8 %
Platelets: 295 10*3/uL (ref 140–400)
RBC: 5.55 10*6/uL (ref 4.20–5.80)
RDW: 13.1 % (ref 11.0–15.0)
Total Lymphocyte: 25 %
WBC mixed population: 698 cells/uL (ref 200–950)
WBC: 7.5 10*3/uL (ref 3.8–10.8)

## 2017-09-10 LAB — COMPLETE METABOLIC PANEL WITH GFR
AG RATIO: 1.4 (calc) (ref 1.0–2.5)
ALBUMIN MSPROF: 4.2 g/dL (ref 3.6–5.1)
ALT: 10 U/L (ref 9–46)
AST: 15 U/L (ref 10–35)
Alkaline phosphatase (APISO): 53 U/L (ref 40–115)
BILIRUBIN TOTAL: 0.3 mg/dL (ref 0.2–1.2)
BUN / CREAT RATIO: 14 (calc) (ref 6–22)
BUN: 18 mg/dL (ref 7–25)
CHLORIDE: 104 mmol/L (ref 98–110)
CO2: 28 mmol/L (ref 20–32)
CREATININE: 1.26 mg/dL — AB (ref 0.70–1.18)
Calcium: 9.5 mg/dL (ref 8.6–10.3)
GFR, Est African American: 63 mL/min/{1.73_m2} (ref >=60)
GFR, Est Non African American: 55 mL/min/{1.73_m2} — ABNORMAL LOW (ref >=60)
GLOBULIN: 3.1 g/dL (ref 1.9–3.7)
Glucose, Bld: 120 mg/dL — ABNORMAL HIGH (ref 65–99)
Potassium: 4.4 mmol/L (ref 3.5–5.3)
SODIUM: 140 mmol/L (ref 135–146)
Total Protein: 7.3 g/dL (ref 6.1–8.1)

## 2017-09-10 LAB — TEST AUTHORIZATION

## 2017-09-10 LAB — HEMOGLOBIN A1C W/OUT EAG: HEMOGLOBIN A1C: 5.7 %{Hb} — AB (ref <5.7)

## 2017-09-10 NOTE — Telephone Encounter (Signed)
PA Submitted through [email or web address] and received the following:   Sent to [employer]  The plan will fax you a determination, typically within 1 to 5 business days.

## 2017-09-11 MED ORDER — DICLOFENAC SODIUM 1 % TD GEL: 2.0000 g | Freq: Four times a day (QID) | TRANSDERMAL | 3 refills | Status: DC

## 2017-09-11 NOTE — Telephone Encounter (Signed)
Med approved through 07/28/2018 - pharm aware

## 2017-09-29 ENCOUNTER — Other Ambulatory Visit: Payer: Self-pay | Admitting: Family Medicine

## 2017-10-17 DIAGNOSIS — D2239 Melanocytic nevi of other parts of face: Secondary | ICD-10-CM | POA: Diagnosis not present

## 2017-10-17 DIAGNOSIS — D1801 Hemangioma of skin and subcutaneous tissue: Secondary | ICD-10-CM | POA: Diagnosis not present

## 2017-10-17 DIAGNOSIS — L853 Xerosis cutis: Secondary | ICD-10-CM | POA: Diagnosis not present

## 2017-10-17 DIAGNOSIS — L814 Other melanin hyperpigmentation: Secondary | ICD-10-CM | POA: Diagnosis not present

## 2017-10-17 DIAGNOSIS — L821 Other seborrheic keratosis: Secondary | ICD-10-CM | POA: Diagnosis not present

## 2017-10-30 ENCOUNTER — Other Ambulatory Visit: Payer: Self-pay | Admitting: Family Medicine

## 2017-12-02 ENCOUNTER — Other Ambulatory Visit: Payer: Self-pay | Admitting: Family Medicine

## 2017-12-02 ENCOUNTER — Other Ambulatory Visit: Payer: Self-pay

## 2017-12-02 ENCOUNTER — Encounter: Payer: Self-pay | Admitting: Family Medicine

## 2017-12-02 ENCOUNTER — Emergency Department (HOSPITAL_COMMUNITY): Payer: Medicare HMO

## 2017-12-02 ENCOUNTER — Ambulatory Visit (INDEPENDENT_AMBULATORY_CARE_PROVIDER_SITE_OTHER): Payer: Medicare HMO | Admitting: Family Medicine

## 2017-12-02 ENCOUNTER — Encounter (HOSPITAL_COMMUNITY): Payer: Self-pay

## 2017-12-02 ENCOUNTER — Inpatient Hospital Stay (HOSPITAL_COMMUNITY)
Admission: EM | Admit: 2017-12-02 | Discharge: 2017-12-05 | DRG: 394 | Disposition: A | Discharge status: Home Health | Payer: Medicare HMO | Attending: Internal Medicine | Admitting: Internal Medicine

## 2017-12-02 VITALS — Temp 98.3°F | Wt 218.5 lb

## 2017-12-02 DIAGNOSIS — Z6832 Body mass index (BMI) 32.0-32.9, adult: Secondary | ICD-10-CM

## 2017-12-02 DIAGNOSIS — E669 Obesity, unspecified: Secondary | ICD-10-CM | POA: Diagnosis present

## 2017-12-02 DIAGNOSIS — Z79899 Other long term (current) drug therapy: Secondary | ICD-10-CM

## 2017-12-02 DIAGNOSIS — K6289 Other specified diseases of anus and rectum: Secondary | ICD-10-CM | POA: Diagnosis present

## 2017-12-02 DIAGNOSIS — Z881 Allergy status to other antibiotic agents status: Secondary | ICD-10-CM

## 2017-12-02 DIAGNOSIS — R339 Retention of urine, unspecified: Secondary | ICD-10-CM

## 2017-12-02 DIAGNOSIS — R4182 Altered mental status, unspecified: Secondary | ICD-10-CM

## 2017-12-02 DIAGNOSIS — Z7982 Long term (current) use of aspirin: Secondary | ICD-10-CM

## 2017-12-02 DIAGNOSIS — R509 Fever, unspecified: Secondary | ICD-10-CM | POA: Diagnosis not present

## 2017-12-02 DIAGNOSIS — K5900 Constipation, unspecified: Secondary | ICD-10-CM | POA: Diagnosis present

## 2017-12-02 DIAGNOSIS — K5641 Fecal impaction: Secondary | ICD-10-CM | POA: Diagnosis not present

## 2017-12-02 DIAGNOSIS — Z87891 Personal history of nicotine dependence: Secondary | ICD-10-CM

## 2017-12-02 DIAGNOSIS — N179 Acute kidney failure, unspecified: Secondary | ICD-10-CM | POA: Diagnosis present

## 2017-12-02 DIAGNOSIS — R6510 Systemic inflammatory response syndrome (SIRS) of non-infectious origin without acute organ dysfunction: Secondary | ICD-10-CM | POA: Diagnosis not present

## 2017-12-02 DIAGNOSIS — R197 Diarrhea, unspecified: Secondary | ICD-10-CM | POA: Diagnosis not present

## 2017-12-02 DIAGNOSIS — R531 Weakness: Secondary | ICD-10-CM | POA: Diagnosis not present

## 2017-12-02 DIAGNOSIS — E872 Acidosis: Secondary | ICD-10-CM | POA: Diagnosis present

## 2017-12-02 DIAGNOSIS — F0390 Unspecified dementia without behavioral disturbance: Secondary | ICD-10-CM | POA: Diagnosis present

## 2017-12-02 DIAGNOSIS — R109 Unspecified abdominal pain: Secondary | ICD-10-CM | POA: Diagnosis not present

## 2017-12-02 DIAGNOSIS — E861 Hypovolemia: Secondary | ICD-10-CM | POA: Diagnosis present

## 2017-12-02 DIAGNOSIS — R140 Abdominal distension (gaseous): Secondary | ICD-10-CM | POA: Diagnosis not present

## 2017-12-02 DIAGNOSIS — E860 Dehydration: Secondary | ICD-10-CM | POA: Diagnosis present

## 2017-12-02 LAB — COMPLETE METABOLIC PANEL WITH GFR
AG RATIO: 1.2 (calc) (ref 1.0–2.5)
ALT: 12 U/L (ref 9–46)
AST: 15 U/L (ref 10–35)
Albumin: 4.2 g/dL (ref 3.6–5.1)
Alkaline phosphatase (APISO): 54 U/L (ref 40–115)
BUN/Creatinine Ratio: 8 (calc) (ref 6–22)
BUN: 30 mg/dL — AB (ref 7–25)
CALCIUM: 9.2 mg/dL (ref 8.6–10.3)
CO2: 26 mmol/L (ref 20–32)
Chloride: 95 mmol/L — ABNORMAL LOW (ref 98–110)
Creat: 3.74 mg/dL — ABNORMAL HIGH (ref 0.70–1.18)
GFR, EST AFRICAN AMERICAN: 17 mL/min/{1.73_m2} — AB (ref >=60)
GFR, EST NON AFRICAN AMERICAN: 15 mL/min/{1.73_m2} — AB (ref >=60)
Globulin: 3.4 g/dL (calc) (ref 1.9–3.7)
Glucose, Bld: 103 mg/dL — ABNORMAL HIGH (ref 65–99)
POTASSIUM: 4.1 mmol/L (ref 3.5–5.3)
Sodium: 132 mmol/L — ABNORMAL LOW (ref 135–146)
TOTAL PROTEIN: 7.6 g/dL (ref 6.1–8.1)
Total Bilirubin: 0.9 mg/dL (ref 0.2–1.2)

## 2017-12-02 LAB — CBC WITH DIFFERENTIAL/PLATELET
BASOS ABS: 53 {cells}/uL (ref 0–200)
BASOS PCT: 0 %
Basophils Absolute: 0 10*3/uL (ref 0.0–0.1)
Basophils Relative: 0.2 %
EOS ABS: 132 {cells}/uL (ref 15–500)
EOS PCT: 0 %
Eosinophils Absolute: 0.1 10*3/uL (ref 0.0–0.7)
Eosinophils Relative: 0.5 %
HCT: 39.9 % (ref 39.0–52.0)
HEMATOCRIT: 45.1 % (ref 38.5–50.0)
HEMOGLOBIN: 16.3 g/dL (ref 13.2–17.1)
Hemoglobin: 14 g/dL (ref 13.0–17.0)
LYMPHS ABS: 2051 {cells}/uL (ref 850–3900)
LYMPHS PCT: 7 %
Lymphs Abs: 1.5 10*3/uL (ref 0.7–4.0)
MCH: 30.4 pg (ref 27.0–33.0)
MCH: 30.4 pg (ref 26.0–34.0)
MCHC: 36.1 g/dL — ABNORMAL HIGH (ref 32.0–36.0)
MCHC: 35.1 g/dL (ref 30.0–36.0)
MCV: 84 fL (ref 80.0–100.0)
MCV: 86.6 fL (ref 78.0–100.0)
MPV: 10.7 fL (ref 7.5–12.5)
Monocytes Absolute: 2.4 10*3/uL — ABNORMAL HIGH (ref 0.1–1.0)
Monocytes Relative: 12.1 %
Monocytes Relative: 10 %
NEUTROS ABS: 20882 {cells}/uL — AB (ref 1500–7800)
NEUTROS ABS: 19.7 10*3/uL — AB (ref 1.7–7.7)
Neutrophils Relative %: 79.4 %
Neutrophils Relative %: 83 %
PLATELETS: 207 10*3/uL (ref 150–400)
Platelets: 270 10*3/uL (ref 140–400)
RBC: 5.37 10*6/uL (ref 4.20–5.80)
RBC: 4.61 MIL/uL (ref 4.22–5.81)
RDW: 14 % (ref 11.0–15.0)
RDW: 13.7 % (ref 11.5–15.5)
Total Lymphocyte: 7.8 %
WBC: 26.3 10*3/uL — ABNORMAL HIGH (ref 3.8–10.8)
WBC: 23.7 10*3/uL — ABNORMAL HIGH (ref 4.0–10.5)
WBCMIX: 3182 {cells}/uL — AB (ref 200–950)

## 2017-12-02 LAB — COMPREHENSIVE METABOLIC PANEL
ALBUMIN: 3.3 g/dL — AB (ref 3.5–5.0)
ALT: 13 U/L — ABNORMAL LOW (ref 17–63)
ANION GAP: 11 (ref 5–15)
AST: 19 U/L (ref 15–41)
Alkaline Phosphatase: 40 U/L (ref 38–126)
BILIRUBIN TOTAL: 0.9 mg/dL (ref 0.3–1.2)
BUN: 27 mg/dL — AB (ref 6–20)
CO2: 21 mmol/L — ABNORMAL LOW (ref 22–32)
Calcium: 8 mg/dL — ABNORMAL LOW (ref 8.9–10.3)
Chloride: 99 mmol/L — ABNORMAL LOW (ref 101–111)
Creatinine, Ser: 2.95 mg/dL — ABNORMAL HIGH (ref 0.61–1.24)
GFR calc Af Amer: 22 mL/min — ABNORMAL LOW (ref >60)
GFR, EST NON AFRICAN AMERICAN: 19 mL/min — AB (ref >60)
Glucose, Bld: 135 mg/dL — ABNORMAL HIGH (ref 65–99)
POTASSIUM: 4 mmol/L (ref 3.5–5.1)
SODIUM: 131 mmol/L — AB (ref 135–145)
TOTAL PROTEIN: 6.3 g/dL — AB (ref 6.5–8.1)

## 2017-12-02 LAB — URINALYSIS, ROUTINE W REFLEX MICROSCOPIC
BILIRUBIN URINE: NEGATIVE
Bacteria, UA: NONE SEEN
GLUCOSE, UA: NEGATIVE mg/dL
KETONES UR: NEGATIVE mg/dL
Leukocytes, UA: NEGATIVE
NITRITE: NEGATIVE
PROTEIN: NEGATIVE mg/dL
Specific Gravity, Urine: 1.009 (ref 1.005–1.030)
pH: 5 (ref 5.0–8.0)

## 2017-12-02 LAB — LIPASE, BLOOD: LIPASE: 35 U/L (ref 11–51)

## 2017-12-02 LAB — LIPASE: Lipase: 82 U/L — ABNORMAL HIGH (ref 7–60)

## 2017-12-02 LAB — I-STAT CG4 LACTIC ACID, ED: Lactic Acid, Venous: 2.38 mmol/L (ref 0.5–1.9)

## 2017-12-02 LAB — GLUCOSE 16585: Glucose: 101 mg/dL — ABNORMAL HIGH (ref 65–99)

## 2017-12-02 MED ORDER — CIPROFLOXACIN HCL 500 MG PO TABS: 500.0000 mg | ORAL_TABLET | Freq: Two times a day (BID) | ORAL | 0 refills | Status: DC

## 2017-12-02 MED ORDER — SODIUM CHLORIDE 0.9 % IV BOLUS
1000.0000 mL | Freq: Once | INTRAVENOUS | Status: AC
  Administered 2017-12-02: 1000 mL via INTRAVENOUS

## 2017-12-02 MED ORDER — IOPAMIDOL (ISOVUE-300) INJECTION 61%
INTRAVENOUS | Status: AC
  Filled 2017-12-02: qty 30

## 2017-12-02 NOTE — ED Provider Notes (Signed)
She is an elderly demented 77 year old male, pleasant, presents after having a fever last night, was present this morning, went to the doctor's office because of inability to urinate with a fever, got disimpacted for his fecal impaction and then urinated extensively.  On exam the patient has a soft abdomen, he is obese, his lungs are clear, heart is regular, good pulses, no edema.  Oropharynx is clear moist.  White blood cell count is significantly elevated, the patient does have an elevated lactic acid, suspect that there is some type of infection, work-up will include chest x-ray and a CT scan of the abdomen and pelvis as the urinalysis is clean.  Medical screening examination/treatment/procedure(s) were conducted as a shared visit with non-physician practitioner(s) and myself.  I personally evaluated the patient during the encounter.  Clinical Impression:   Final diagnoses:  Proctitis  AKI (acute kidney injury) ([place])         Noemi Chapel, MD 12/03/17 [phone]

## 2017-12-02 NOTE — Progress Notes (Signed)
Subjective:    Patient ID: Kyle Watkins, male    DOB: [birth year], 77 y.o.   MRN: [id number]  HPI Patient was seen in conjunction with my partner.  2 days ago the patient started having frequent trips to the bathroom.  Patient has dementia therefore we are unsure whether he was going to void or to have diarrhea.  Beginning yesterday, he had copious diarrhea with bouts of fecal incontinence.  He was also experiencing polyuria.  There is no available history regarding constipation as the patient goes to the bathroom independently and his wife is unsure whether he is having bowel movements or not.  Yesterday, the patient stopped urinating.  However he frequently goes to the bathroom feeling the urge to urinate and to defecate.  The concern was for dehydration with the recent history of diarrhea and now no urine output.  Patient was given an IV in the clinic and received 1 L of normal saline.  Patient felt the urge to urinate however he was unable to give [place] a urine sample afterward.  This raises the concern of postobstructive uropathy.  Therefore I performed a rectal exam.  Patient was severely impacted with stool.  Patient was manually disimpacted at bedside.  I was able to remove a stool ball roughly the size of my fist.  Immediately after removing the blockage, the patient urinated all over the floor.  Unfortunately we are unable to collect a sterile specimen given the unexpected urination after the disimpaction.  I estimate that the patient urinated more than 200 cc.  Therefore my fear of anuria was alleviated.  However the patient is having fevers the last 2 nights raising the concern of possible urinary tract infection versus prostatitis particular given the setting of severe constipation. Past Medical History:  Diagnosis Date  . Dementia    Past Surgical History:  Procedure Laterality Date  . Arm surgery     . MOUTH SURGERY    . SKIN GRAFT     Current Outpatient Medications on File Prior to  Visit  Medication Sig Dispense Refill  . aspirin EC 81 MG tablet Take 81 mg by mouth daily.    . cholecalciferol (VITAMIN D) 1000 units tablet Take 1,000 Units by mouth daily.    . ciprofloxacin (CIPRO) 500 MG tablet Take 1 tablet (500 mg total) by mouth 2 (two) times daily. 20 tablet 0  . diclofenac sodium (VOLTAREN) 1 % GEL Apply 2 g topically 4 (four) times daily. 100 g 3  . donepezil (ARICEPT) 10 MG tablet TAKE 1 TABLET BY MOUTH AT BEDTIME 30 tablet 1  . memantine (NAMENDA) 10 MG tablet TAKE 1 TABLET BY MOUTH TWICE A DAY 60 tablet 11   No current facility-administered medications on file prior to visit.    Allergies  Allergen Reactions  . Keflex [Cephalexin] Itching and Rash   Social History   Socioeconomic History  . Marital status: Married    Spouse name: Not on file  . Number of children: Not on file  . Years of education: Not on file  . Highest education level: Not on file  Occupational History  . Not on file  Social Needs  . Financial resource strain: Not on file  . Food insecurity:    Worry: Not on file    Inability: Not on file  . Transportation needs:    Medical: Not on file    Non-medical: Not on file  Tobacco Use  . Smoking status: Former Smoker  Types: Cigarettes    Last attempt to quit: 02/22/1959    Years since quitting: 58.8  . Smokeless tobacco: Never Used  Substance and Sexual Activity  . Alcohol use: No  . Drug use: No  . Sexual activity: Not on file  Lifestyle  . Physical activity:    Days per week: Not on file    Minutes per session: Not on file  . Stress: Not on file  Relationships  . Social connections:    Talks on phone: Not on file    Gets together: Not on file    Attends religious service: Not on file    Active member of club or organization: Not on file    Attends meetings of clubs or organizations: Not on file    Relationship status: Not on file  . Intimate partner violence:    Fear of current or ex partner: Not on file     Emotionally abused: Not on file    Physically abused: Not on file    Forced sexual activity: Not on file  Other Topics Concern  . Not on file  Social History Narrative  . Not on file      Review of Systems  All other systems reviewed and are negative.      Objective:   Physical Exam  Cardiovascular: Normal rate, regular rhythm and normal heart sounds.  Pulmonary/Chest: Effort normal and breath sounds normal.  Abdominal: Soft. He exhibits distension.  Genitourinary: Rectal exam shows mass.             Assessment & Plan:  Fecal impaction suspect diarrhea secondary to fecal impaction, decreased urine output suspected secondary to obstructive uropathy.  Fever of unknown origin.  I was unable to obtain a urine sample however given his fever, I am concerned about a urinary tract infection.  Therefore I will treat the patient empirically with Cipro 500 mg p.o. twice daily for 10 days.  He will follow-up in the clinic tomorrow.  BMP is pending to evaluate renal function however given his urine output after disimpaction, I have feel somewhat reassured that he is not in renal failure.  Patient is instructed to go home and push Gatorade tonight in addition to his antibiotics.  If he is not making urine prior to bedtime, he is to go to the emergency room for oliguria.  Otherwise follow-up in clinic tomorrow.  Patient, his wife, and his daughter are comfortable with this plan.

## 2017-12-02 NOTE — ED Provider Notes (Signed)
[facility] Provider Note   CSN: [id number] Arrival date & time: 12/02/17  2154     History   Chief Complaint Chief Complaint  Patient presents with  . Sent by doctor    HPI JAREK LONGTON is a 77 y.o. male.  The history is provided by the patient and medical records.     77 y.o. M with hx of dementia, presenting to the ED from PCP office due to abnormal labs.  Patient was taken to PCP earlier today by family due to trouble urinating and new fever.  In the office patient was found to be fecally impacted, he was disimpacted in the and afterwards was able to urinate about a liter.  Unfortunately this went onto the floor and sample was not collected.  He was also given small bolus of IV fluids.  Family reports after this he seemed to have improvement in his overall appearance and has been urinating fine at home since then.  States he was able to eat this evening, some soup and half of a sandwich.  He is also had some loose, watery bowel movements since leaving PCP office.  They were called this evening and notified that patient's white blood cell count was very high and his creatinine was over 3.  He was encouraged to come to the ED.  He has not had any reported cough.  Fevers been ongoing for about 2 days, unsure of temperature at home as they do not have a thermometer.  He has not had any bloody bowel movements.  Past Medical History:  Diagnosis Date  . Dementia     There are no active problems to display for this patient.   Past Surgical History:  Procedure Laterality Date  . Arm surgery     . MOUTH SURGERY    . SKIN GRAFT          Home Medications    Prior to Admission medications   Medication Sig Start Date End Date Taking? Authorizing Provider  aspirin EC 81 MG tablet Take 81 mg by mouth daily.    [provider]  cholecalciferol (VITAMIN D) 1000 units tablet Take 1,000 Units by mouth daily.    [provider]  ciprofloxacin (CIPRO) 500 MG tablet Take 1 tablet (500 mg total) by mouth 2 (two) times daily. 12/02/17   Susy Frizzle, MD  diclofenac sodium (VOLTAREN) 1 % GEL Apply 2 g topically 4 (four) times daily. 09/11/17   Susy Frizzle, MD  donepezil (ARICEPT) 10 MG tablet TAKE 1 TABLET BY MOUTH AT BEDTIME 10/30/17   Susy Frizzle, MD  memantine (NAMENDA) 10 MG tablet TAKE 1 TABLET BY MOUTH TWICE A DAY 09/29/17   Susy Frizzle, MD    Family History No family history on file.  Social History Social History   Tobacco Use  . Smoking status: Former Smoker    Types: Cigarettes    Last attempt to quit: 02/22/1959    Years since quitting: 58.8  . Smokeless tobacco: Never Used  Substance Use Topics  . Alcohol use: No  . Drug use: No     Allergies   Keflex [cephalexin]   Review of Systems Review of Systems  Constitutional: Positive for fever.  Gastrointestinal: Positive for abdominal pain.  All other systems reviewed and are negative.    Physical Exam Updated Vital Signs BP (!) 153/129 (BP Location: Right Arm)   Pulse 98   Temp 99.1 F (  37.3 C) (Oral)   Resp 18   SpO2 98%   Physical Exam  Constitutional: He appears well-developed and well-nourished.  HENT:  Head: Normocephalic and atraumatic.  Mouth/Throat: Oropharynx is clear and moist.  Mildly dry mucous membranes  Eyes: Pupils are equal, round, and reactive to light. Conjunctivae and EOM are normal.  Neck: Normal range of motion.  Cardiovascular: Normal rate, regular rhythm and normal heart sounds.  Pulmonary/Chest: Effort normal and breath sounds normal. No stridor. No respiratory distress.  Abdominal: Soft. Bowel sounds are normal. There is no tenderness. There is no rebound.  Skin graft scars noted to left abdominal wall, abdomen is mildly firm (family reports baseline), normal bowel sounds  Musculoskeletal: Normal range of motion.  Neurological: He is alert.  Alert, oriented to self only (baseline)    Skin: Skin is warm and dry.  Psychiatric: He has a normal mood and affect.  Nursing note and vitals reviewed.    ED Treatments / Results  Labs (all labs ordered are listed, but only abnormal results are displayed) Labs Reviewed  COMPREHENSIVE METABOLIC PANEL - Abnormal; Notable for the following components:      Result Value   Sodium 131 (*)    Chloride 99 (*)    CO2 21 (*)    Glucose, Bld 135 (*)    BUN 27 (*)    Creatinine, Ser 2.95 (*)    Calcium 8.0 (*)    Total Protein 6.3 (*)    Albumin 3.3 (*)    ALT 13 (*)    GFR calc non Af Amer 19 (*)    GFR calc Af Amer 22 (*)    All other components within normal limits  CBC WITH DIFFERENTIAL/PLATELET - Abnormal; Notable for the following components:   WBC 23.7 (*)    Neutro Abs 19.7 (*)    Monocytes Absolute 2.4 (*)    All other components within normal limits  URINALYSIS, ROUTINE W REFLEX MICROSCOPIC - Abnormal; Notable for the following components:   Hgb urine dipstick SMALL (*)    All other components within normal limits  I-STAT CG4 LACTIC ACID, ED - Abnormal; Notable for the following components:   Lactic Acid, Venous 2.38 (*)    All other components within normal limits  CULTURE, BLOOD (ROUTINE X 2)  CULTURE, BLOOD (ROUTINE X 2)  URINE CULTURE  LIPASE, BLOOD  I-STAT CG4 LACTIC ACID, ED    EKG None  Radiology Ct Abdomen Pelvis Wo Contrast  Result Date: 12/03/2017 CLINICAL DATA:  77 year old male with abdominal distention and diarrhea. EXAM: CT ABDOMEN AND PELVIS WITHOUT CONTRAST TECHNIQUE: Multidetector CT imaging of the abdomen and pelvis was performed following the standard protocol without IV contrast. COMPARISON:  None. FINDINGS: Evaluation of this exam is limited in the absence of intravenous contrast. Lower chest: The visualized lung bases are clear. No intra-abdominal free air or free fluid. Hepatobiliary: Probable fatty infiltration of the liver. No intrahepatic biliary ductal dilatation. No calcified  gallstone. Pancreas: Unremarkable. No pancreatic ductal dilatation or surrounding inflammatory changes. Spleen: Normal in size without focal abnormality. Adrenals/Urinary Tract: The adrenal glands are unremarkable. There is mild fullness of the renal collecting systems bilaterally. No stone. The visualized ureters appear unremarkable. The urinary bladder is distended appears unremarkable. Stomach/Bowel: There is a small hiatal hernia. There is no bowel obstruction. There is mild circumferential thickening of the rectal wall concerning for proctitis. Clinical correlation is recommended. The appendix is normal. Vascular/Lymphatic: Mild aortoiliac atherosclerotic disease. No portal venous gas. There  is no adenopathy. Reproductive: The prostate and seminal vesicles are grossly unremarkable. Other: There is mild diffuse mesenteric and abdominal wall edema. Musculoskeletal: Degenerative changes of the spine with multilevel disc desiccation and vacuum phenomena. Chronic changes and irregularity of the left pelvic bone. No acute fracture. IMPRESSION: 1. Findings suspicious for proctitis. Clinical correlation is recommended. No bowel obstruction. Normal appendix. 2. Mild fullness of the renal collecting system.  No stone. 3. Mild mesenteric and subcutaneous soft tissue edema. Electronically Signed   By: Anner Crete M.D.   On: 12/03/2017 04:55   Dg Chest 2 View  Result Date: 12/02/2017 CLINICAL DATA:  77 year old male with fever. EXAM: CHEST - 2 VIEW COMPARISON:  Chest radiograph dated 05/02/2017 FINDINGS: Shallow inspiration with bibasilar atelectasis. No focal consolidation, pleural effusion, or pneumothorax. Stable cardiac silhouette. No acute osseous pathology. IMPRESSION: No active cardiopulmonary disease. Electronically Signed   By: Anner Crete M.D.   On: 12/02/2017 23:52    Procedures Procedures (including critical care time)  Medications Ordered in ED Medications  iopamidol (ISOVUE-300) 61 %  injection (has no administration in time range)  0.9 %  sodium chloride infusion (has no administration in time range)  sodium chloride 0.9 % bolus 1,000 mL (0 mLs Intravenous Stopped 12/03/17 0118)     Initial Impression / Assessment and Plan / ED Course  I have reviewed the triage vital signs and the nursing notes.  Pertinent labs & imaging results that were available during my care of the patient were reviewed by me and considered in my medical decision making (see chart for details).  77 y.o. M here with fever and abnormal labs, sent in by PCP.  Fever x2 days, difficulty urinating and found to be fecally impacted in office with urinary retention.  Was disimpacted in office, urinated out about 1L.  WBC count high with AKI.  Here patient overall appears well, low grade temp but non-toxic in appearance.  Abdomen mildly distended, no peritoneal signs.  Will repeat labs, blood and urine cultures, CXR, CTAP.  IVF given.  Labs with leukocytosis, AKI but improved from labs earlier today.  Leukocytosis still present.  Lactate mildly elevated at 2.38, cleared with IVF.  CXR clear.  CT with findings of proctitis-- may due to to his fecal impaction that was resolved earlier today.  Will still require admission for AKI which I suspect was post-renal from retention.  Blood and urine cultures sent given high WBC count w/left shift.  Discussed with Dr. Hal Hope, will admit for ongoing care.  Final Clinical Impressions(s) / ED Diagnoses   Final diagnoses:  Proctitis  AKI (acute kidney injury) [facility])    ED Discharge Orders    None       Larene Pickett, PA-C 12/03/17 0556    Noemi Chapel, MD 12/03/17 [phone]

## 2017-12-02 NOTE — Progress Notes (Signed)
Patient ID: Kyle Watkins, male    DOB: Oct 10, 1940, 77 y.o.   MRN: [id number]  PCP: Susy Frizzle, MD  Chief Complaint  Patient presents with  . Fever    Onset of fevers yesterday. Has tried tylenol, and ibuprofen. Also c/o loose stools.     Subjective:   DERMOT GREMILLION is a 77 y.o. male, past medical history of Alzheimer's, dyslipidemia, presents to clinic with his wife and his daughter for 3 days of fevers, frequent trips to the commode and loose stool.  Patient is not at his baseline mental status today and is very weak and more confused, is not speaking at all during the exam history is provided by the wife and his daughter.  She states that 2 days ago he kept running to the bathroom during the day and several times at night and he had stated to her, "my kidneys shut down."  She did not ask if he was having difficulty urinating or pain or decreased urine output but she did start helping him drink large amounts of fluids and afterwards he had watery loose stools and incontinence of urine and stool.  He has been having fevers for the past 3 days which she has treated with Tylenol and ibuprofen.  He has been flushed and somewhat red in the face and today is more pale.    History limited by patient's dementia which is worse today he is not communicating at all with his wife or daughter.  He typically does not speak to anyone but his family, but last was communicating with his wife about 3 days ago.  She wants to run to the bathroom multiple times but she did not inquire what his symptoms were whether they were urinary or bowel.  She reports 2 days ago he started having incontinence of stool and urine, possibly had some blood drips in the toilet but she did not know what it came from.  He has had fever, sweats been flushed and now today is more pale.  She has not seen any vomiting.  Stomach does not appear different than normal for him.  She states he has had no history of abdominal  surgeries except for a skin graft taken from his left abdomen.    No known sick contacts, no other illness such as URIs or coughs.  No falls but he is having trouble with his strength and gait they are assisting him walking around and getting on and things are in and out of the car.   Only medical history is dementia worked up a few years ago believed to be Alzheimer's.  There are no active problems to display for this patient.    Prior to Admission medications   Medication Sig Start Date End Date Taking? Authorizing Provider  aspirin EC 81 MG tablet Take 81 mg by mouth daily.   Yes [provider]  cholecalciferol (VITAMIN D) 1000 units tablet Take 1,000 Units by mouth daily.   Yes [provider]  diclofenac sodium (VOLTAREN) 1 % GEL Apply 2 g topically 4 (four) times daily. 09/11/17  Yes Susy Frizzle, MD  donepezil (ARICEPT) 10 MG tablet TAKE 1 TABLET BY MOUTH AT BEDTIME 10/30/17  Yes Susy Frizzle, MD  memantine (NAMENDA) 10 MG tablet TAKE 1 TABLET BY MOUTH TWICE A DAY 09/29/17  Yes Susy Frizzle, MD     Allergies  Allergen Reactions  . Keflex [Cephalexin] Itching and Rash  No family history on file.   Social History   Socioeconomic History  . Marital status: Married    Spouse name: Not on file  . Number of children: Not on file  . Years of education: Not on file  . Highest education level: Not on file  Occupational History  . Not on file  Social Needs  . Financial resource strain: Not on file  . Food insecurity:    Worry: Not on file    Inability: Not on file  . Transportation needs:    Medical: Not on file    Non-medical: Not on file  Tobacco Use  . Smoking status: Former Smoker    Types: Cigarettes    Last attempt to quit: 02/22/1959    Years since quitting: 58.8  . Smokeless tobacco: Never Used  Substance and Sexual Activity  . Alcohol use: No  . Drug use: No  . Sexual activity: Not on file  Lifestyle  . Physical activity:      Days per week: Not on file    Minutes per session: Not on file  . Stress: Not on file  Relationships  . Social connections:    Talks on phone: Not on file    Gets together: Not on file    Attends religious service: Not on file    Active member of club or organization: Not on file    Attends meetings of clubs or organizations: Not on file    Relationship status: Not on file  . Intimate partner violence:    Fear of current or ex partner: Not on file    Emotionally abused: Not on file    Physically abused: Not on file    Forced sexual activity: Not on file  Other Topics Concern  . Not on file  Social History Narrative  . Not on file     Review of Systems  Unable to perform ROS: Dementia       Objective:    Vitals:   12/02/17 1525  BP: 124/82  Pulse: 95  Temp: 98.3 F (36.8 C)  TempSrc: Oral  SpO2: 98%  Weight: 218 lb 8 oz (99.1 kg)      Physical Exam  Constitutional: He appears well-developed and well-nourished.  Non-toxic appearance. He does not appear ill. No distress.  Elderly male, obese, unwell appearing, a little pale and mildly diaphoretic, no acute distress, alert and will look at you when asking questions but will not verbally respond to anything  HENT:  Head: Normocephalic and atraumatic.  Right Ear: Tympanic membrane, external ear and ear canal normal.  Left Ear: Tympanic membrane, external ear and ear canal normal.  Nose: No mucosal edema or rhinorrhea. Right sinus exhibits no maxillary sinus tenderness and no frontal sinus tenderness. Left sinus exhibits no maxillary sinus tenderness and no frontal sinus tenderness.  Mouth/Throat: Uvula is midline. No trismus in the jaw. No uvula swelling. No oropharyngeal exudate, posterior oropharyngeal edema or posterior oropharyngeal erythema.  Mucous membranes dry  Eyes: Pupils are equal, round, and reactive to light. Conjunctivae, EOM and lids are normal.  Neck: Trachea normal, normal range of motion and  phonation normal. Neck supple. No tracheal deviation present.  Cardiovascular: Normal rate, regular rhythm, normal heart sounds, intact distal pulses and normal pulses. Exam reveals no gallop and no friction rub.  No murmur heard. Pulses:      Radial pulses are 2+ on the right side, and 2+ on the left side.  Posterior tibial pulses are 2+ on the right side, and 2+ on the left side.  Pulmonary/Chest: Effort normal and breath sounds normal. No stridor. No respiratory distress. He has no wheezes. He has no rhonchi. He has no rales.  Abdominal: Soft. Normal appearance and bowel sounds are normal. He exhibits no distension and no mass. There is no tenderness. There is no rebound and no guarding.  He nods his head to tenderness, generalized to his abdomen, no focal tenderness, no rebound or guarding, no CVA tenderness, normal bowel sounds x4 Left abdomen with large scar regular scar  Musculoskeletal: Normal range of motion. He exhibits no edema.  Neurological: He is alert. Gait normal.  Alert, not oriented to person, place or time Will follow some simple commands Will not answer questions to me, but does not and response to wife and daughter Unsteady gait Symmetrical 5 out of 5 grip strength bilaterally and dorsiflexion plantarflexion bilaterally    Skin: Skin is warm and intact. Capillary refill takes 2 to 3 seconds. No rash noted. He is diaphoretic. No cyanosis. There is pallor. Nails show no clubbing.  Psychiatric: His speech is normal.  Nursing note and vitals reviewed.         Assessment & Plan:      ICD-10-CM   1. Fever, unspecified fever cause R50.9 Urinalysis, Routine w reflex microscopic    COMPLETE METABOLIC PANEL WITH GFR    CBC with Differential/Platelet    Urine Culture    Lipase  2. Diarrhea, unspecified type P95.0 COMPLETE METABOLIC PANEL WITH GFR    CBC with Differential/Platelet    Orthostatic vital signs    Lipase  3. Weak R53.1 Urinalysis, Routine w reflex  microscopic    COMPLETE METABOLIC PANEL WITH GFR    CBC with Differential/Platelet    Urine Culture    Orthostatic vital signs    Glucose, fingerstick (stat)    GLUCOSE [id number]  4. Altered mental status, unspecified altered mental status type R41.82   5. Acute renal failure, unspecified acute renal failure type ([place]) N17.9   6. Fecal impaction ([place]) K56.41   7. Urinary retention R33.9      No drop in blood pressure with laying and sitting however patient appears orthostatic when attempting to get up or ambulate, requires at least 1 person to assist.  Patient is not at his baseline mental status, has been febrile diaphoretic, appears dry, unable to obtain urine sample because of his incontinence of stool and urine which is new for him.  I have discussed at length with the patient's wife and with his daughter that would be safer to go to the ER for work-up which would get fluids, labs images and diagnostics whenever he needs much more quickly.  They do not want to go and prefer to do labs here.  I have explained that we can get some blood labs stat but it still takes a few hours, we have given him a small bolus of IV fluids and his coloring has improved, his vital signs continue to be stable.  Were unable to get a urine sample, I have still encouraged him to go to the ER for evaluation and have explained that he could quickly become septic, he may already be encephalopathic from infection and we will not know what it is or what to treat until tonight when we cannot act on it in clinic.  CBG is normal.    Delsa Grana, PA-C 12/02/17 3:51 PM   5:40 PM Dr.  Pickard saw and evaluated the patient, is his PCP.  Per Dr. Dennard Schaumann -he manually disimpacted patient and he was able to urinate approximately a gallon of urine when all over the floor, outlet flow obstruction secondary to stool impaction. Unable to obtain a urinalysis sample, however urine volume is reassuring of functioning kidneys. Pickard is  placing patient on Cipro 500 mg twice daily x10 days.  Patient is still urged to go to ER with any acute mental status changes, difficulty ambulating, any nausea vomiting continued sweats or chills

## 2017-12-02 NOTE — Progress Notes (Signed)
Reviewing labs - WBC elevated, AKI. Called patient and spoke to his wife, advised to go to he ER.  For leukocytosis and AKI   Hope repeat labs will show improvement from when in clinic earlier today after IVF, fecal impaction relieved and resolved acute urinary retention.  But given increase of sCr, feel pt needs at least repeat labs, or obs to watch for renal function to return to baseline.  Report to be called to [facility] [place] Gave report to [employer]

## 2017-12-02 NOTE — ED Triage Notes (Signed)
Pt sent here by PCP, since Sun had diarrhea had diarrhea, saw PCP today and told he was impacted and, WBC 26 and AKI with creatinine of 3. Denies abd pain, reports dysuria, fevers for the past two days.

## 2017-12-03 ENCOUNTER — Ambulatory Visit: Payer: Medicare HMO | Admitting: Family Medicine

## 2017-12-03 ENCOUNTER — Encounter (HOSPITAL_COMMUNITY): Payer: Self-pay | Admitting: Internal Medicine

## 2017-12-03 ENCOUNTER — Emergency Department (HOSPITAL_COMMUNITY): Payer: Medicare HMO

## 2017-12-03 ENCOUNTER — Other Ambulatory Visit: Payer: Self-pay

## 2017-12-03 DIAGNOSIS — Z7982 Long term (current) use of aspirin: Secondary | ICD-10-CM | POA: Diagnosis not present

## 2017-12-03 DIAGNOSIS — K6289 Other specified diseases of anus and rectum: Secondary | ICD-10-CM | POA: Diagnosis not present

## 2017-12-03 DIAGNOSIS — E860 Dehydration: Secondary | ICD-10-CM | POA: Diagnosis not present

## 2017-12-03 DIAGNOSIS — Z6832 Body mass index (BMI) 32.0-32.9, adult: Secondary | ICD-10-CM | POA: Diagnosis not present

## 2017-12-03 DIAGNOSIS — F0390 Unspecified dementia without behavioral disturbance: Secondary | ICD-10-CM | POA: Diagnosis present

## 2017-12-03 DIAGNOSIS — R509 Fever, unspecified: Secondary | ICD-10-CM | POA: Diagnosis not present

## 2017-12-03 DIAGNOSIS — Z87891 Personal history of nicotine dependence: Secondary | ICD-10-CM | POA: Diagnosis not present

## 2017-12-03 DIAGNOSIS — R69 Illness, unspecified: Secondary | ICD-10-CM | POA: Diagnosis not present

## 2017-12-03 DIAGNOSIS — N179 Acute kidney failure, unspecified: Secondary | ICD-10-CM

## 2017-12-03 DIAGNOSIS — E861 Hypovolemia: Secondary | ICD-10-CM | POA: Diagnosis not present

## 2017-12-03 DIAGNOSIS — E669 Obesity, unspecified: Secondary | ICD-10-CM | POA: Diagnosis not present

## 2017-12-03 DIAGNOSIS — R6510 Systemic inflammatory response syndrome (SIRS) of non-infectious origin without acute organ dysfunction: Secondary | ICD-10-CM | POA: Diagnosis not present

## 2017-12-03 DIAGNOSIS — R269 Unspecified abnormalities of gait and mobility: Secondary | ICD-10-CM | POA: Diagnosis not present

## 2017-12-03 DIAGNOSIS — Z881 Allergy status to other antibiotic agents status: Secondary | ICD-10-CM | POA: Diagnosis not present

## 2017-12-03 DIAGNOSIS — R140 Abdominal distension (gaseous): Secondary | ICD-10-CM | POA: Diagnosis not present

## 2017-12-03 DIAGNOSIS — E872 Acidosis: Secondary | ICD-10-CM | POA: Diagnosis not present

## 2017-12-03 DIAGNOSIS — Z79899 Other long term (current) drug therapy: Secondary | ICD-10-CM | POA: Diagnosis not present

## 2017-12-03 DIAGNOSIS — K5900 Constipation, unspecified: Secondary | ICD-10-CM | POA: Diagnosis not present

## 2017-12-03 LAB — CBC
HCT: 38.3 % — ABNORMAL LOW (ref 39.0–52.0)
Hemoglobin: 13.4 g/dL (ref 13.0–17.0)
MCH: 30.2 pg (ref 26.0–34.0)
MCHC: 35 g/dL (ref 30.0–36.0)
MCV: 86.3 fL (ref 78.0–100.0)
Platelets: 196 10*3/uL (ref 150–400)
RBC: 4.44 MIL/uL (ref 4.22–5.81)
RDW: 13.7 % (ref 11.5–15.5)
WBC: 17.8 10*3/uL — ABNORMAL HIGH (ref 4.0–10.5)

## 2017-12-03 LAB — CREATININE, URINE, RANDOM: CREATININE, URINE: 34.32 mg/dL

## 2017-12-03 LAB — CREATININE, SERUM
CREATININE: 2.15 mg/dL — AB (ref 0.61–1.24)
GFR calc non Af Amer: 28 mL/min — ABNORMAL LOW (ref >60)
GFR, EST AFRICAN AMERICAN: 32 mL/min — AB (ref >60)

## 2017-12-03 LAB — I-STAT CG4 LACTIC ACID, ED: Lactic Acid, Venous: 1.72 mmol/L (ref 0.5–1.9)

## 2017-12-03 LAB — SODIUM, URINE, RANDOM

## 2017-12-03 MED ORDER — DONEPEZIL HCL 10 MG PO TABS
10.0000 mg | ORAL_TABLET | Freq: Every day | ORAL | Status: DC
  Administered 2017-12-03 – 2017-12-04 (×2): 10 mg via ORAL
  Filled 2017-12-03 (×3): qty 1

## 2017-12-03 MED ORDER — VITAMIN D 1000 UNITS PO TABS
1000.0000 [IU] | ORAL_TABLET | Freq: Every day | ORAL | Status: DC
  Administered 2017-12-03 – 2017-12-05 (×3): 1000 [IU] via ORAL
  Filled 2017-12-03 (×3): qty 1

## 2017-12-03 MED ORDER — SODIUM CHLORIDE 0.9 % IV SOLN
INTRAVENOUS | Status: AC
  Administered 2017-12-03 (×3): via INTRAVENOUS

## 2017-12-03 MED ORDER — METRONIDAZOLE IN NACL 5-0.79 MG/ML-% IV SOLN
500.0000 mg | Freq: Three times a day (TID) | INTRAVENOUS | Status: DC
  Administered 2017-12-03 – 2017-12-05 (×7): 500 mg via INTRAVENOUS
  Filled 2017-12-03 (×7): qty 100

## 2017-12-03 MED ORDER — CIPROFLOXACIN IN D5W 400 MG/200ML IV SOLN
400.0000 mg | INTRAVENOUS | Status: DC
  Administered 2017-12-03 – 2017-12-04 (×2): 400 mg via INTRAVENOUS
  Filled 2017-12-03 (×2): qty 200

## 2017-12-03 MED ORDER — ASPIRIN EC 81 MG PO TBEC
81.0000 mg | DELAYED_RELEASE_TABLET | Freq: Every day | ORAL | Status: DC
  Administered 2017-12-03 – 2017-12-05 (×3): 81 mg via ORAL
  Filled 2017-12-03 (×3): qty 1

## 2017-12-03 MED ORDER — ONDANSETRON HCL 4 MG PO TABS: 4.0000 mg | ORAL_TABLET | Freq: Four times a day (QID) | ORAL | Status: DC | PRN

## 2017-12-03 MED ORDER — ONDANSETRON HCL 4 MG/2ML IJ SOLN: 4.0000 mg | Freq: Four times a day (QID) | INTRAMUSCULAR | Status: DC | PRN

## 2017-12-03 MED ORDER — ACETAMINOPHEN 325 MG PO TABS: 650.0000 mg | ORAL_TABLET | Freq: Four times a day (QID) | ORAL | Status: DC | PRN

## 2017-12-03 MED ORDER — HEPARIN SODIUM (PORCINE) 5000 UNIT/ML IJ SOLN
5000.0000 [IU] | Freq: Three times a day (TID) | INTRAMUSCULAR | Status: DC
  Administered 2017-12-03 – 2017-12-05 (×6): 5000 [IU] via SUBCUTANEOUS
  Filled 2017-12-03 (×6): qty 1

## 2017-12-03 MED ORDER — ACETAMINOPHEN 650 MG RE SUPP: 650.0000 mg | Freq: Four times a day (QID) | RECTAL | Status: DC | PRN

## 2017-12-03 MED ORDER — MEMANTINE HCL 10 MG PO TABS
10.0000 mg | ORAL_TABLET | Freq: Two times a day (BID) | ORAL | Status: DC
  Administered 2017-12-03 – 2017-12-05 (×5): 10 mg via ORAL
  Filled 2017-12-03 (×7): qty 1

## 2017-12-03 NOTE — ED Notes (Signed)
Assisted patient with bedside commode.  Bed pad changed, peri care done by this RN

## 2017-12-03 NOTE — ED Notes (Signed)
Attempted report x1. 

## 2017-12-03 NOTE — ED Notes (Signed)
MD K at bedside 

## 2017-12-03 NOTE — ED Notes (Signed)
Patient transported to CT 

## 2017-12-03 NOTE — Progress Notes (Signed)
Pharmacy Antibiotic Note  Kyle Watkins is a 77 y.o. male admitted on 12/02/2017 with intra-abdominal infection.  Pharmacy has been consulted for Cipro dosing. CT with possible proctitis. WBC elevated. Noted renal dysfunction.   Plan: Cipro 400 mg IV q24h Flagyl per MD Trend WBC, temp, renal function  F/U infectious work-up  Temp (24hrs), Avg:99.1 F (37.3 C), Min:98.3 F (36.8 C), Max:99.9 F (37.7 C)  Recent Labs  Lab 12/02/17 1616 12/02/17 2233 12/02/17 2241 12/03/17 0123  WBC 26.3* 23.7*  --   --   CREATININE 3.74* 2.95*  --   --   LATICACIDVEN  --   --  2.38* 1.72    Estimated Creatinine Clearance: 24.3 mL/min (A) (by C-G formula based on SCr of 2.95 mg/dL (H)).    Allergies  Allergen Reactions  . Keflex [Cephalexin] Itching and Rash     Kyle Watkins 12/03/2017 6:23 AM

## 2017-12-03 NOTE — ED Notes (Signed)
Explained to fammily why CT abdomen was taking so long to result. Family is not pleased.

## 2017-12-03 NOTE — H&P (Signed)
History and Physical    JAVORIS STAR IWL:[id number] DOB: Dec 25, 1940 DOA: 12/02/2017  PCP: Susy Frizzle, MD  Patient coming from: Home.  History obtained from patient's wife.  Chief Complaint: Abnormal labs.  HPI: Kyle Watkins is a 77 y.o. male with history of dementia was brought to the ER after patient was found to have abnormal labs at the primary care's office.  As per the patient's wife patient has been having multiple episodes of diarrhea 2 days ago.  Since yesterday morning patient has been having difficulty urinating despite giving her adequate oral intake.  Did not have any nausea vomiting or have any abdominal pain.  Has been having some fever-like symptoms but no temperature was recorded.  During the evening yesterday patient was taken to the primary care physician for difficulty urinating.  As per the patient's wife primary care physician was trying to examine the prostate and found to have fecal impaction which was disimpacted.  Following this patient had significant amount of urine output.  Labs were drawn and patient was discharged home on Cipro.  After the labs came back it was found the patient had acute renal failure and leukocytosis and was instructed to come to the ER.  ED Course: In the ER patient was found to be mildly febrile tachycardic with elevated lactate with labs showing creatinine of 2.95.  Actually today at primary care's office creatinine was 3.74.  Lactate was 2.3 WBC count of 23,000.  UA chest x-ray is unremarkable.  CT abdomen pelvis done shows features concerning for proctitis and also there was mild eccentric and abdominal wall edema.  Patient was given a liter fluid bolus following which lactate improved.  Patient is making urine output.  Patient admitted for further management of acute renal failure and SIRS picture.  Review of Systems: As per HPI, rest all negative.   Past Medical History:  Diagnosis Date  . Dementia     Past Surgical  History:  Procedure Laterality Date  . Arm surgery     . MOUTH SURGERY    . SKIN GRAFT       reports that he quit smoking about 58 years ago. His smoking use included cigarettes. He has never used smokeless tobacco. He reports that he does not drink alcohol or use drugs.  Allergies  Allergen Reactions  . Keflex [Cephalexin] Itching and Rash    Family History  Problem Relation Age of Onset  . Diabetes Mellitus II Neg Hx   . CAD Neg Hx     Prior to Admission medications   Medication Sig Start Date End Date Taking? Authorizing Provider  aspirin EC 81 MG tablet Take 81 mg by mouth daily.   Yes [provider]  cholecalciferol (VITAMIN D) 1000 units tablet Take 1,000 Units by mouth daily.   Yes [provider]  ciprofloxacin (CIPRO) 500 MG tablet Take 1 tablet (500 mg total) by mouth 2 (two) times daily. 12/02/17  Yes Susy Frizzle, MD  Cyanocobalamin (VITAMIN B-12 PO) Take 1 tablet by mouth daily.   Yes [provider]  donepezil (ARICEPT) 10 MG tablet TAKE 1 TABLET BY MOUTH AT BEDTIME 10/30/17  Yes Susy Frizzle, MD  loratadine (CLARITIN) 10 MG tablet Take 10 mg by mouth daily as needed for allergies.   Yes [provider]  memantine (NAMENDA) 10 MG tablet TAKE 1 TABLET BY MOUTH TWICE A DAY 09/29/17  Yes Susy Frizzle, MD  diclofenac sodium (VOLTAREN) 1 %  GEL Apply 2 g topically 4 (four) times daily. Patient not taking: Reported on 12/02/2017 09/11/17   Susy Frizzle, MD    Physical Exam: Vitals:   12/03/17 0330 12/03/17 0445 12/03/17 0530 12/03/17 0545  BP: 121/68 125/75 124/72 (!) 131/107  Pulse:   76   Resp:   (!) 29   Temp:      TempSrc:      SpO2: 95% 95% 94% 93%      Constitutional: Moderately built and nourished. Vitals:   12/03/17 0330 12/03/17 0445 12/03/17 0530 12/03/17 0545  BP: 121/68 125/75 124/72 (!) 131/107  Pulse:   76   Resp:   (!) 29   Temp:      TempSrc:      SpO2: 95% 95% 94% 93%   Eyes: Anicteric no  pallor. ENMT: No discharge from the ears eyes nose or mouth. Neck: No mass palpated no JVD appreciated. Respiratory: No rhonchi or crepitations. Cardiovascular: S1-S2 heard no murmurs appreciated. Abdomen: Soft nontender bowel sounds present but old scars on the left side of the abdomen. Musculoskeletal: No edema.  No joint effusion. Skin: No rash.  Will start on the left abdomen. Neurologic: Alert awake oriented to his name close commands moves all extremities. Psychiatric: Patient has dementia but oriented to name and follows commands.   Labs on Admission: I have personally reviewed following labs and imaging studies  CBC: Recent Labs  Lab 12/02/17 1616 12/02/17 2233  WBC 26.3* 23.7*  NEUTROABS 20,882* 19.7*  HGB 16.3 14.0  HCT 45.1 39.9  MCV 84.0 86.6  PLT 270 619   Basic Metabolic Panel: Recent Labs  Lab 12/02/17 1616 12/02/17 2233  NA 132* 131*  K 4.1 4.0  CL 95* 99*  CO2 26 21*  GLUCOSE 103* 135*  BUN 30* 27*  CREATININE 3.74* 2.95*  CALCIUM 9.2 8.0*   GFR: Estimated Creatinine Clearance: 24.3 mL/min (A) (by C-G formula based on SCr of 2.95 mg/dL (H)). Liver Function Tests: Recent Labs  Lab 12/02/17 1616 12/02/17 2233  AST 15 19  ALT 12 13*  ALKPHOS  --  40  BILITOT 0.9 0.9  PROT 7.6 6.3*  ALBUMIN  --  3.3*   Recent Labs  Lab 12/02/17 1616 12/02/17 2233  LIPASE 82* 35   No results for input(s): AMMONIA in the last 168 hours. Coagulation Profile: No results for input(s): INR, PROTIME in the last 168 hours. Cardiac Enzymes: No results for input(s): CKTOTAL, CKMB, CKMBINDEX, TROPONINI in the last 168 hours. BNP (last 3 results) No results for input(s): PROBNP in the last 8760 hours. HbA1C: No results for input(s): HGBA1C in the last 72 hours. CBG: No results for input(s): GLUCAP in the last 168 hours. Lipid Profile: No results for input(s): CHOL, HDL, LDLCALC, TRIG, CHOLHDL, LDLDIRECT in the last 72 hours. Thyroid Function Tests: No results  for input(s): TSH, T4TOTAL, FREET4, T3FREE, THYROIDAB in the last 72 hours. Anemia Panel: No results for input(s): VITAMINB12, FOLATE, FERRITIN, TIBC, IRON, RETICCTPCT in the last 72 hours. Urine analysis:    Component Value Date/Time   COLORURINE YELLOW 12/02/2017 2217   APPEARANCEUR CLEAR 12/02/2017 2217   LABSPEC 1.009 12/02/2017 2217   PHURINE 5.0 12/02/2017 2217   GLUCOSEU NEGATIVE 12/02/2017 2217   HGBUR SMALL (A) 12/02/2017 2217   [place] NEGATIVE 12/02/2017 2217   [place] NEGATIVE 12/02/2017 2217   PROTEINUR NEGATIVE 12/02/2017 2217   NITRITE NEGATIVE 12/02/2017 2217   LEUKOCYTESUR NEGATIVE 12/02/2017 2217   Sepsis Labs: @LABRCNTIP (procalcitonin:4,lacticidven:4) )No  results found for this or any previous visit (from the past 240 hour(s)).   Radiological Exams on Admission: Ct Abdomen Pelvis Wo Contrast  Result Date: 12/03/2017 CLINICAL DATA:  77 year old male with abdominal distention and diarrhea. EXAM: CT ABDOMEN AND PELVIS WITHOUT CONTRAST TECHNIQUE: Multidetector CT imaging of the abdomen and pelvis was performed following the standard protocol without IV contrast. COMPARISON:  None. FINDINGS: Evaluation of this exam is limited in the absence of intravenous contrast. Lower chest: The visualized lung bases are clear. No intra-abdominal free air or free fluid. Hepatobiliary: Probable fatty infiltration of the liver. No intrahepatic biliary ductal dilatation. No calcified gallstone. Pancreas: Unremarkable. No pancreatic ductal dilatation or surrounding inflammatory changes. Spleen: Normal in size without focal abnormality. Adrenals/Urinary Tract: The adrenal glands are unremarkable. There is mild fullness of the renal collecting systems bilaterally. No stone. The visualized ureters appear unremarkable. The urinary bladder is distended appears unremarkable. Stomach/Bowel: There is a small hiatal hernia. There is no bowel obstruction. There is mild circumferential thickening of  the rectal wall concerning for proctitis. Clinical correlation is recommended. The appendix is normal. Vascular/Lymphatic: Mild aortoiliac atherosclerotic disease. No portal venous gas. There is no adenopathy. Reproductive: The prostate and seminal vesicles are grossly unremarkable. Other: There is mild diffuse mesenteric and abdominal wall edema. Musculoskeletal: Degenerative changes of the spine with multilevel disc desiccation and vacuum phenomena. Chronic changes and irregularity of the left pelvic bone. No acute fracture. IMPRESSION: 1. Findings suspicious for proctitis. Clinical correlation is recommended. No bowel obstruction. Normal appendix. 2. Mild fullness of the renal collecting system.  No stone. 3. Mild mesenteric and subcutaneous soft tissue edema. Electronically Signed   By: Anner Crete M.D.   On: 12/03/2017 04:55   Dg Chest 2 View  Result Date: 12/02/2017 CLINICAL DATA:  77 year old male with fever. EXAM: CHEST - 2 VIEW COMPARISON:  Chest radiograph dated 05/02/2017 FINDINGS: Shallow inspiration with bibasilar atelectasis. No focal consolidation, pleural effusion, or pneumothorax. Stable cardiac silhouette. No acute osseous pathology. IMPRESSION: No active cardiopulmonary disease. Electronically Signed   By: Anner Crete M.D.   On: 12/02/2017 23:52    Assessment/Plan Active Problems:   SIRS (systemic inflammatory response syndrome) (HCC)   ARF (acute renal failure) (HCC)   Dementia    1. SIRS -patient on admission was tachycardic mildly febrile leukocytosis lactate was elevated.  Blood cultures were obtained along with urine cultures.  Since CAT scan shows proctitis patient is empirically placed on Cipro and Flagyl.  Continue with hydration follow cultures.  Follow CBC. 2. Acute renal failure -creatinine on February 2019 was 1.26.  It has acutely worsened likely from urinary retention from fecal impaction.  Will check FENa and patient is making adequate urine at this time.  We  will closely follow intake output metabolic panel and continue to gently hydrate. 3. History of dementia on Namenda and Aricept. 4. Abdominal wall edema and mesenteric edema per the CAT scan.  We will closely observe.   DVT prophylaxis: Lovenox. Code Status: Full code. Family Communication: Patient's mother and daughter. Disposition Plan: Home. Consults called: None. Admission status: Inpatient.   Rise Patience MD [facility] Pager [phone].  If 7PM-7AM, please contact night-coverage [email or web address] Password [email or web address]  12/03/2017, 6:02 AM

## 2017-12-03 NOTE — Progress Notes (Signed)
Patient is a 77 year old Caucasian male with past medical history significant for dementia.  The patient was admitted earlier today with acute kidney injury-thought to be secondary to obstruction.  Acute kidney injury is slowly resolving.  We will continue IV fluids for now.  Continue to monitor renal function.  Hopefully, the patient will be discharged in 1 to 2 days.

## 2017-12-03 NOTE — ED Notes (Signed)
Family brought lunch for patient.

## 2017-12-03 NOTE — ED Notes (Signed)
Patient's daughter asking for update.  Updated on most recent MD notes and POC.  Verbalized understanding

## 2017-12-03 NOTE — ED Notes (Signed)
Ordered lunch tray 

## 2017-12-04 DIAGNOSIS — K6289 Other specified diseases of anus and rectum: Secondary | ICD-10-CM | POA: Diagnosis present

## 2017-12-04 LAB — CBC WITH DIFFERENTIAL/PLATELET
BASOS PCT: 0 %
Basophils Absolute: 0 10*3/uL (ref 0.0–0.1)
Eosinophils Absolute: 0.5 10*3/uL (ref 0.0–0.7)
Eosinophils Relative: 4 %
HEMATOCRIT: 39.2 % (ref 39.0–52.0)
HEMOGLOBIN: 13 g/dL (ref 13.0–17.0)
LYMPHS ABS: 1.6 10*3/uL (ref 0.7–4.0)
LYMPHS PCT: 12 %
MCH: 29.3 pg (ref 26.0–34.0)
MCHC: 33.2 g/dL (ref 30.0–36.0)
MCV: 88.5 fL (ref 78.0–100.0)
MONO ABS: 1.6 10*3/uL — AB (ref 0.1–1.0)
MONOS PCT: 12 %
NEUTROS ABS: 9.7 10*3/uL — AB (ref 1.7–7.7)
NEUTROS PCT: 72 %
PLATELETS: 212 10*3/uL (ref 150–400)
RBC: 4.43 MIL/uL (ref 4.22–5.81)
RDW: 14.1 % (ref 11.5–15.5)
WBC: 13.4 10*3/uL — ABNORMAL HIGH (ref 4.0–10.5)

## 2017-12-04 LAB — BASIC METABOLIC PANEL
ANION GAP: 9 (ref 5–15)
BUN: 21 mg/dL — ABNORMAL HIGH (ref 6–20)
CHLORIDE: 108 mmol/L (ref 101–111)
CO2: 26 mmol/L (ref 22–32)
Calcium: 8.1 mg/dL — ABNORMAL LOW (ref 8.9–10.3)
Creatinine, Ser: 1.64 mg/dL — ABNORMAL HIGH (ref 0.61–1.24)
GFR calc non Af Amer: 38 mL/min — ABNORMAL LOW (ref >60)
GFR, EST AFRICAN AMERICAN: 45 mL/min — AB (ref >60)
GLUCOSE: 121 mg/dL — AB (ref 65–99)
POTASSIUM: 4.3 mmol/L (ref 3.5–5.1)
Sodium: 143 mmol/L (ref 135–145)

## 2017-12-04 LAB — HEPATIC FUNCTION PANEL
ALBUMIN: 2.9 g/dL — AB (ref 3.5–5.0)
ALT: 14 U/L — AB (ref 17–63)
AST: 20 U/L (ref 15–41)
Alkaline Phosphatase: 38 U/L (ref 38–126)
Bilirubin, Direct: 0.2 mg/dL (ref 0.1–0.5)
Indirect Bilirubin: 0.4 mg/dL (ref 0.3–0.9)
Total Bilirubin: 0.6 mg/dL (ref 0.3–1.2)
Total Protein: 6.1 g/dL — ABNORMAL LOW (ref 6.5–8.1)

## 2017-12-04 LAB — URINE CULTURE: CULTURE: NO GROWTH

## 2017-12-04 LAB — MAGNESIUM: Magnesium: 2.2 mg/dL (ref 1.7–2.4)

## 2017-12-04 MED ORDER — TAMSULOSIN HCL 0.4 MG PO CAPS
0.4000 mg | ORAL_CAPSULE | Freq: Every day | ORAL | Status: DC
  Administered 2017-12-04: 0.4 mg via ORAL
  Filled 2017-12-04: qty 1

## 2017-12-04 MED ORDER — CIPROFLOXACIN IN D5W 400 MG/200ML IV SOLN
400.0000 mg | Freq: Two times a day (BID) | INTRAVENOUS | Status: DC
Start: 2017-12-04 — End: 2017-12-05
  Administered 2017-12-04 – 2017-12-05 (×2): 400 mg via INTRAVENOUS
  Filled 2017-12-04 (×2): qty 200

## 2017-12-04 MED ORDER — SENNOSIDES-DOCUSATE SODIUM 8.6-50 MG PO TABS
1.0000 | ORAL_TABLET | Freq: Two times a day (BID) | ORAL | Status: DC
  Administered 2017-12-04 – 2017-12-05 (×3): 1 via ORAL
  Filled 2017-12-04 (×3): qty 1

## 2017-12-04 MED ORDER — SODIUM CHLORIDE 0.9 % IV SOLN
INTRAVENOUS | Status: DC
  Administered 2017-12-05: 03:00:00 via INTRAVENOUS

## 2017-12-04 NOTE — Progress Notes (Signed)
[facility]                                                                              Patient Demographics  Kyle Watkins, is a 77 y.o. male, DOB - 01/19/1941, XAJ:[id number]  Admit date - 12/02/2017   Admitting Physician Rise Patience, MD  Outpatient Primary MD for the patient is Susy Frizzle, MD  Outpatient specialists:   LOS - 1  days   Medical records reviewed and are as summarized below:    Chief Complaint  Patient presents with  . Sent by doctor       Brief summary   [name] with a history of dementia, presented to ED after found to have abnormal labs at the primary care office.  Patient's wife reported that he had multiple episodes of diarrhea 2 days prior to admission, a day before he had difficulty urinating.  Patient also reported fever-like symptoms.  Patient went to his PCP and was found to have fecal impaction, was disimpacted.  Following that patient had significant amount of urine output.  He was discharged home on ciprofloxacin.  Patient was found to have acute renal insufficiency, leukocytosis and then was recommended to come to the ED.  CT abdomen showed proctitis with mild eccentric and abdominal wall edema, creatinine 3.74, WBCs 23.    Assessment & Plan    Principal Problem:   Proctitis with SIRS -Patient met SIRS criteria with tachycardia, mildly febrile, leukocytosis, lactic acidosis - CT abdomen showed proctitis, - UA negative for UTI, follow blood cultures -Continue IV Cipro and Flagyl cytosis improving to 6.3 at the time of admission, improved to 13.4 today    Active Problems:  Acute kidney injury: Likely due to obstruction from proctitis, dehydration from diarrhea -Placed on IV fluid hydration, creatinine improving -Creatinine 3.74 at the time of admission, baseline 1.2 -Improved to 1.6 today, continue IV fluids  Constipation -Placed on Senokot S twice daily    Dementia -  currently at  baseline  Lactic acidosis -Secondary to #1, dehydration, hypovolemia. -Lactic acid improved with IV fluid hydration  Code Status: Full CODE STATUS DVT Prophylaxis: Heparin subcu Family Communication: Discussed in detail with the patient, all imaging results, lab results explained to the patient and wife   Disposition Plan: Possibly DC home in a.m.  Time Spent in minutes  35* minutes  Procedures:  CT abdomen  Consultants:   None  Antimicrobials:   IV Cipro 5/8   IV Flagyl 5/8   Medications  Scheduled Meds: . aspirin EC  81 mg Oral Daily  . cholecalciferol  1,000 Units Oral Daily  . donepezil  10 mg Oral QHS  . heparin  5,000 Units Subcutaneous Q8H  . memantine  10 mg Oral BID  . tamsulosin  0.4 mg Oral Daily   Continuous Infusions: . sodium chloride    . ciprofloxacin 400 mg (12/04/17 0551)  . metronidazole Stopped (12/04/17 0819)   PRN Meds:.acetaminophen **OR** acetaminophen, ondansetron **OR** ondansetron (ZOFRAN) IV   Antibiotics   Anti-infectives (From admission, onward)   Start     Dose/Rate Route Frequency Ordered Stop  12/03/17 0700  metroNIDAZOLE (FLAGYL) IVPB 500 mg     500 mg 100 mL/hr over 60 Minutes Intravenous Every 8 hours 12/03/17 0602     12/03/17 0630  ciprofloxacin (CIPRO) IVPB 400 mg     400 mg 200 mL/hr over 60 Minutes Intravenous Every 24 hours 12/03/17 0622          Subjective:   Deaven Urwin was seen and examined today.  Feeling somewhat better, no fevers this morning. Patient denies dizziness, chest pain, shortness of breath, abdominal pain, N/V/D/C, new weakness, numbess, tingling.   Objective:   Vitals:   12/03/17 1700 12/03/17 2044 12/03/17 2115 12/04/17 0633  BP: 117/84 132/62  140/74  Pulse:  80  70  Resp: _0 Temp:  99 F (37.2 C)  98 F (36.7 C)  TempSrc:  Oral  Oral  SpO2:  95%  97%  Weight:   100.7 kg (222 lb)   Height:   _1  (1.753 m)     Intake/Output Summary (Last 24 hours) at 12/04/2017  1304 Last data filed at 12/04/2017 0955 Gross per 24 hour  Intake 2695 ml  Output 1825 ml  Net 870 ml     Wt Readings from Last 3 Encounters:  12/03/17 100.7 kg (222 lb)  12/02/17 99.1 kg (218 lb 8 oz)  09/08/17 99.8 kg (220 lb)     Exam  General: Alert and oriented, NAD  Eyes:   HEENT:  Atraumatic, normocephalic, normal oropharynx  Cardiovascular: S1 S2 auscultated, no rubs, murmurs or gallops. Regular rate and rhythm.  Respiratory: Clear to auscultation bilaterally, no wheezing, rales or rhonchi  Gastrointestinal: Soft, nontender, nondistended, + bowel sounds  Ext: no pedal edema bilaterally  Neuro: no new deficits  Musculoskeletal: No digital cyanosis, clubbing  Skin: No rashes  Psych: Normal affect and demeanor, alert and oriented x3    Data Reviewed:  I have personally reviewed following labs and imaging studies  Micro Results Recent Results (from the past 240 hour(s))  Urine culture     Status: None   Collection Time: 12/02/17 10:45 PM  Result Value Ref Range Status   Specimen Description URINE, RANDOM  Final   Special Requests NONE  Final   Culture   Final    NO GROWTH Performed at [facility], [address] [place], [place] [address]    Report Status 12/04/2017 FINAL  Final  Blood culture (routine x 2)     Status: None (Preliminary result)   Collection Time: 12/02/17 11:05 PM  Result Value Ref Range Status   Specimen Description BLOOD RIGHT HAND  Final   Special Requests   Final    BOTTLES DRAWN AEROBIC AND ANAEROBIC Blood Culture adequate volume   Culture   Final    NO GROWTH 1 DAY Performed at [facility], [place] [address], [place], [place] [address]    Report Status PENDING  Incomplete  Blood culture (routine x 2)     Status: None (Preliminary result)   Collection Time: 12/02/17 11:12 PM  Result Value Ref Range Status   Specimen Description BLOOD LEFT FOREARM  Final   Special Requests   Final    BOTTLES DRAWN AEROBIC AND  ANAEROBIC Blood Culture adequate volume   Culture   Final    NO GROWTH 1 DAY Performed at [facility], [place] [address] [place], [place] [address]    Report Status PENDING  Incomplete    Radiology Reports  Ct Abdomen Pelvis Wo Contrast  Result Date: 12/03/2017 CLINICAL DATA:  78 year old male with abdominal distention and diarrhea. EXAM: CT ABDOMEN AND PELVIS WITHOUT CONTRAST TECHNIQUE: Multidetector CT imaging of the abdomen and pelvis was performed following the standard protocol without IV contrast. COMPARISON:  None. FINDINGS: Evaluation of this exam is limited in the absence of intravenous contrast. Lower chest: The visualized lung bases are clear. No intra-abdominal free air or free fluid. Hepatobiliary: Probable fatty infiltration of the liver. No intrahepatic biliary ductal dilatation. No calcified gallstone. Pancreas: Unremarkable. No pancreatic ductal dilatation or surrounding inflammatory changes. Spleen: Normal in size without focal abnormality. Adrenals/Urinary Tract: The adrenal glands are unremarkable. There is mild fullness of the renal collecting systems bilaterally. No stone. The visualized ureters appear unremarkable. The urinary bladder is distended appears unremarkable. Stomach/Bowel: There is a small hiatal hernia. There is no bowel obstruction. There is mild circumferential thickening of the rectal wall concerning for proctitis. Clinical correlation is recommended. The appendix is normal. Vascular/Lymphatic: Mild aortoiliac atherosclerotic disease. No portal venous gas. There is no adenopathy. Reproductive: The prostate and seminal vesicles are grossly unremarkable. Other: There is mild diffuse mesenteric and abdominal wall edema. Musculoskeletal: Degenerative changes of the spine with multilevel disc desiccation and vacuum phenomena. Chronic changes and irregularity of the left pelvic bone. No acute fracture. IMPRESSION: 1. Findings suspicious for proctitis. Clinical  correlation is recommended. No bowel obstruction. Normal appendix. 2. Mild fullness of the renal collecting system.  No stone. 3. Mild mesenteric and subcutaneous soft tissue edema. Electronically Signed   By: Anner Crete M.D.   On: 12/03/2017 04:55   Dg Chest 2 View  Result Date: 12/02/2017 CLINICAL DATA:  77 year old male with fever. EXAM: CHEST - 2 VIEW COMPARISON:  Chest radiograph dated 05/02/2017 FINDINGS: Shallow inspiration with bibasilar atelectasis. No focal consolidation, pleural effusion, or pneumothorax. Stable cardiac silhouette. No acute osseous pathology. IMPRESSION: No active cardiopulmonary disease. Electronically Signed   By: Anner Crete M.D.   On: 12/02/2017 23:52    Lab Data:  CBC: Recent Labs  Lab 12/02/17 1616 12/02/17 2233 12/03/17 0619 12/04/17 0331  WBC 26.3* 23.7* 17.8* 13.4*  NEUTROABS 20,882* 19.7*  --  9.7*  HGB 16.3 14.0 13.4 13.0  HCT 45.1 39.9 38.3* 39.2  MCV 84.0 86.6 86.3 88.5  PLT 270 207 196 681   Basic Metabolic Panel: Recent Labs  Lab 12/02/17 1616 12/02/17 2233 12/03/17 0619 12/04/17 0331  NA 132* 131*  --  143  K 4.1 4.0  --  4.3  CL 95* 99*  --  108  CO2 26 21*  --  26  GLUCOSE 103* 135*  --  121*  BUN 30* 27*  --  21*  CREATININE 3.74* 2.95* 2.15* 1.64*  CALCIUM 9.2 8.0*  --  8.1*  MG  --   --   --  2.2   GFR: Estimated Creatinine Clearance: 43.4 mL/min (A) (by C-G formula based on SCr of 1.64 mg/dL (H)). Liver Function Tests: Recent Labs  Lab 12/02/17 1616 12/02/17 2233 12/04/17 0331  AST _0 ALT 12 13* 14*  ALKPHOS  --  40 38  BILITOT 0.9 0.9 0.6  PROT 7.6 6.3* 6.1*  ALBUMIN  --  3.3* 2.9*   Recent Labs  Lab 12/02/17 1616 12/02/17 2233  LIPASE 82* 35   No results for input(s): AMMONIA in the last 168 hours. Coagulation Profile: No results for input(s): INR, PROTIME in the last 168 hours. Cardiac Enzymes: No results  for input(s): CKTOTAL, CKMB, CKMBINDEX, TROPONINI in the last 168 hours. BNP  (last 3 results) No results for input(s): PROBNP in the last 8760 hours. HbA1C: No results for input(s): HGBA1C in the last 72 hours. CBG: No results for input(s): GLUCAP in the last 168 hours. Lipid Profile: No results for input(s): CHOL, HDL, LDLCALC, TRIG, CHOLHDL, LDLDIRECT in the last 72 hours. Thyroid Function Tests: No results for input(s): TSH, T4TOTAL, FREET4, T3FREE, THYROIDAB in the last 72 hours. Anemia Panel: No results for input(s): VITAMINB12, FOLATE, FERRITIN, TIBC, IRON, RETICCTPCT in the last 72 hours. Urine analysis:    Component Value Date/Time   COLORURINE YELLOW 12/02/2017 2217   APPEARANCEUR CLEAR 12/02/2017 2217   LABSPEC 1.009 12/02/2017 2217   PHURINE 5.0 12/02/2017 2217   GLUCOSEU NEGATIVE 12/02/2017 2217   HGBUR SMALL (A) 12/02/2017 2217   BILIRUBINUR NEGATIVE 12/02/2017 2217   [place] NEGATIVE 12/02/2017 2217   PROTEINUR NEGATIVE 12/02/2017 2217   NITRITE NEGATIVE 12/02/2017 2217   LEUKOCYTESUR NEGATIVE 12/02/2017 2217     Ripudeep Rai M.D. [facility] 12/04/2017, 1:04 PM  Pager: [phone] Between 7am to 7pm - call Pager - [phone]  After 7pm go to [email or web address] - password [email or web address]  Call night coverage person covering after 7pm

## 2017-12-05 ENCOUNTER — Other Ambulatory Visit: Payer: Self-pay | Admitting: Family Medicine

## 2017-12-05 LAB — BASIC METABOLIC PANEL
ANION GAP: 8 (ref 5–15)
BUN: 13 mg/dL (ref 6–20)
CO2: 23 mmol/L (ref 22–32)
Calcium: 7.6 mg/dL — ABNORMAL LOW (ref 8.9–10.3)
Chloride: 111 mmol/L (ref 101–111)
Creatinine, Ser: 1.36 mg/dL — ABNORMAL HIGH (ref 0.61–1.24)
GFR calc Af Amer: 56 mL/min — ABNORMAL LOW (ref >60)
GFR, EST NON AFRICAN AMERICAN: 48 mL/min — AB (ref >60)
GLUCOSE: 101 mg/dL — AB (ref 65–99)
POTASSIUM: 3.6 mmol/L (ref 3.5–5.1)
SODIUM: 142 mmol/L (ref 135–145)

## 2017-12-05 LAB — CBC
HEMATOCRIT: 35.7 % — AB (ref 39.0–52.0)
HEMOGLOBIN: 11.7 g/dL — AB (ref 13.0–17.0)
MCH: 29 pg (ref 26.0–34.0)
MCHC: 32.8 g/dL (ref 30.0–36.0)
MCV: 88.6 fL (ref 78.0–100.0)
Platelets: 216 10*3/uL (ref 150–400)
RBC: 4.03 MIL/uL — ABNORMAL LOW (ref 4.22–5.81)
RDW: 14.2 % (ref 11.5–15.5)
WBC: 11 10*3/uL — ABNORMAL HIGH (ref 4.0–10.5)

## 2017-12-05 MED ORDER — CIPROFLOXACIN HCL 500 MG PO TABS: 500.0000 mg | ORAL_TABLET | Freq: Two times a day (BID) | ORAL | 0 refills | Status: AC

## 2017-12-05 MED ORDER — METRONIDAZOLE 500 MG PO TABS: 500.0000 mg | ORAL_TABLET | Freq: Three times a day (TID) | ORAL | 0 refills | Status: AC

## 2017-12-05 MED ORDER — ONDANSETRON 4 MG PO TBDP: 4.0000 mg | ORAL_TABLET | Freq: Three times a day (TID) | ORAL | 0 refills | Status: DC | PRN

## 2017-12-05 MED ORDER — SENNOSIDES-DOCUSATE SODIUM 8.6-50 MG PO TABS: 1.0000 | ORAL_TABLET | Freq: Two times a day (BID) | ORAL | 3 refills | Status: DC

## 2017-12-05 MED ORDER — POLYETHYLENE GLYCOL 3350 17 G PO PACK: 17.0000 g | PACK | Freq: Every day | ORAL | 3 refills | Status: AC | PRN

## 2017-12-05 NOTE — Discharge Summary (Addendum)
Physician Discharge Summary   Patient ID: ARTHA CHIASSON MRN: [id number] DOB/AGE: [birth year] 78 y.o.  Admit date: 12/02/2017 Discharge date: 12/05/2017  Primary Care Physician:  Susy Frizzle, MD   Recommendations for Outpatient Follow-up:  1. Follow up with PCP in 1-2 weeks 2. Please obtain BMP/CBC in one week  Home Health: Home health PT OT Equipment/Devices: Rolling walker  Discharge Condition: stable CODE STATUS: FULL Diet recommendation: Heart healthy diet   Discharge Diagnoses:    . SIRS (systemic inflammatory response syndrome) (HCC) . ARF (acute renal failure) ([place]) . Dementia . Fecal impaction with proctitis   Consults: None    Allergies:   Allergies  Allergen Reactions  . Keflex [Cephalexin] Itching and Rash     DISCHARGE MEDICATIONS: Allergies as of 12/05/2017      Reactions   Keflex [cephalexin] Itching, Rash      Medication List    TAKE these medications   aspirin EC 81 MG tablet Take 81 mg by mouth daily.   cholecalciferol 1000 units tablet Commonly known as:  VITAMIN D Take 1,000 Units by mouth daily.   ciprofloxacin 500 MG tablet Commonly known as:  CIPRO Take 1 tablet (500 mg total) by mouth 2 (two) times daily for 7 days. What changed:  when to take this   donepezil 10 MG tablet Commonly known as:  ARICEPT TAKE ONE TABLET BY MOUTH AT BEDTIME   loratadine 10 MG tablet Commonly known as:  CLARITIN Take 10 mg by mouth daily as needed for allergies.   memantine 10 MG tablet Commonly known as:  NAMENDA TAKE 1 TABLET BY MOUTH TWICE A DAY   metroNIDAZOLE 500 MG tablet Commonly known as:  FLAGYL Take 1 tablet (500 mg total) by mouth 3 (three) times daily for 7 days.   ondansetron 4 MG disintegrating tablet Commonly known as:  ZOFRAN ODT Take 1 tablet (4 mg total) by mouth every 8 (eight) hours as needed for nausea or vomiting.   polyethylene glycol packet Commonly known as:  MIRALAX Take 17 g by mouth daily as needed  for mild constipation or moderate constipation (if no BM in 24 hours).   senna-docusate 8.6-50 MG tablet Commonly known as:  Senokot-S Take 1 tablet by mouth 2 (two) times daily.   VITAMIN B-12 PO Take 1 tablet by mouth daily.            Durable Medical Equipment  (From admission, onward)        Start     Ordered   12/05/17 1141  For home use only DME Walker rolling  Once    Question:  Patient needs a walker to treat with the following condition  Answer:  Weakness   12/05/17 1141       Brief H and P: For complete details please refer to admission H and P, but in briefPatient is a 77 year old male with a history of dementia, presented to ED after found to have abnormal labs at the primary care office.  Patient's wife reported that he had multiple episodes of diarrhea 2 days prior to admission, a day before he had difficulty urinating.  Patient also reported fever-like symptoms.  Patient went to his PCP and was found to have fecal impaction, was disimpacted.  Following that patient had significant amount of urine output.  He was discharged home on ciprofloxacin.  Patient was found to have acute renal insufficiency, leukocytosis and then was recommended to come to the ED.  CT abdomen showed proctitis with  mild eccentric and abdominal wall edema, creatinine 3.74, WBCs 23   Hospital Course:   Proctitis with SIRS -Patient met SIRS criteria with tachycardia, mildly febrile, leukocytosis, lactic acidosis - CT abdomen showed proctitis, - UA negative for UTI, blood cultures negative so far -Patient was placed on IV ciprofloxacin and Flagyl, transition to oral Cipro and Flagyl for 7 days. -Patient had fecal impaction prior to admission which led to proctitis, hence placed on stool softener and laxative, recommended fiber diet -Leukocytosis improved   Acute kidney injury: Likely due to obstruction from proctitis, dehydration from diarrhea -Placed on IV fluid hydration, creatinine  improving -Creatinine 3.74 at the time of admission, baseline 1.2 -Improving, patient was placed on IV fluid hydration  Constipation -Placed on Senokot S twice daily, MiraLAX as needed    Dementia -  currently at baseline  Lactic acidosis -Secondary to #1, dehydration, hypovolemia. -Lactic acid improved with IV fluid hydration  Home health PT OT, RN, DME rolling walker placed   Day of Discharge S: Eager to go home, no acute issues, no abdominal pain  BP (!) 168/91 (BP Location: Right Arm) Comment: map 112  Pulse 81   Temp 100.3 F (37.9 C) (Oral)   Resp 18   Ht _0  (1.753 m)   Wt 101.1 kg (222 lb 14.2 oz)   SpO2 97%   BMI 32.91 kg/m   Physical Exam: General: Alert and awake, mental status at baseline not in any acute distress. HEENT: anicteric sclera, pupils reactive to light and accommodation CVS: S1-S2 clear no murmur rubs or gallops Chest: clear to auscultation bilaterally, no wheezing rales or rhonchi Abdomen: soft nontender, nondistended, normal bowel sounds Extremities: no cyanosis, clubbing or edema noted bilaterally Neuro: Cranial nerves II-XII intact, no focal neurological deficits   The results of significant diagnostics from this hospitalization (including imaging, microbiology, ancillary and laboratory) are listed below for reference.      Procedures/Studies:  Ct Abdomen Pelvis Wo Contrast  Result Date: 12/03/2017 CLINICAL DATA:  77 year old male with abdominal distention and diarrhea. EXAM: CT ABDOMEN AND PELVIS WITHOUT CONTRAST TECHNIQUE: Multidetector CT imaging of the abdomen and pelvis was performed following the standard protocol without IV contrast. COMPARISON:  None. FINDINGS: Evaluation of this exam is limited in the absence of intravenous contrast. Lower chest: The visualized lung bases are clear. No intra-abdominal free air or free fluid. Hepatobiliary: Probable fatty infiltration of the liver. No intrahepatic biliary ductal dilatation. No  calcified gallstone. Pancreas: Unremarkable. No pancreatic ductal dilatation or surrounding inflammatory changes. Spleen: Normal in size without focal abnormality. Adrenals/Urinary Tract: The adrenal glands are unremarkable. There is mild fullness of the renal collecting systems bilaterally. No stone. The visualized ureters appear unremarkable. The urinary bladder is distended appears unremarkable. Stomach/Bowel: There is a small hiatal hernia. There is no bowel obstruction. There is mild circumferential thickening of the rectal wall concerning for proctitis. Clinical correlation is recommended. The appendix is normal. Vascular/Lymphatic: Mild aortoiliac atherosclerotic disease. No portal venous gas. There is no adenopathy. Reproductive: The prostate and seminal vesicles are grossly unremarkable. Other: There is mild diffuse mesenteric and abdominal wall edema. Musculoskeletal: Degenerative changes of the spine with multilevel disc desiccation and vacuum phenomena. Chronic changes and irregularity of the left pelvic bone. No acute fracture. IMPRESSION: 1. Findings suspicious for proctitis. Clinical correlation is recommended. No bowel obstruction. Normal appendix. 2. Mild fullness of the renal collecting system.  No stone. 3. Mild mesenteric and subcutaneous soft tissue edema. Electronically Signed   By: Milas Hock  Radparvar M.D.   On: 12/03/2017 04:55   Dg Chest 2 View  Result Date: 12/02/2017 CLINICAL DATA:  77 year old male with fever. EXAM: CHEST - 2 VIEW COMPARISON:  Chest radiograph dated 05/02/2017 FINDINGS: Shallow inspiration with bibasilar atelectasis. No focal consolidation, pleural effusion, or pneumothorax. Stable cardiac silhouette. No acute osseous pathology. IMPRESSION: No active cardiopulmonary disease. Electronically Signed   By: Anner Crete M.D.   On: 12/02/2017 23:52      LAB RESULTS: Basic Metabolic Panel: Recent Labs  Lab 12/04/17 0331 12/05/17 0534  NA 143 142  K 4.3 3.6  CL  108 111  CO2 26 23  GLUCOSE 121* 101*  BUN 21* 13  CREATININE 1.64* 1.36*  CALCIUM 8.1* 7.6*  MG 2.2  --    Liver Function Tests: Recent Labs  Lab 12/02/17 2233 12/04/17 0331  AST 19 20  ALT 13* 14*  ALKPHOS 40 38  BILITOT 0.9 0.6  PROT 6.3* 6.1*  ALBUMIN 3.3* 2.9*   Recent Labs  Lab 12/02/17 1616 12/02/17 2233  LIPASE 82* 35   No results for input(s): AMMONIA in the last 168 hours. CBC: Recent Labs  Lab 12/04/17 0331 12/05/17 0534  WBC 13.4* 11.0*  NEUTROABS 9.7*  --   HGB 13.0 11.7*  HCT 39.2 35.7*  MCV 88.5 88.6  PLT 212 216   Cardiac Enzymes: No results for input(s): CKTOTAL, CKMB, CKMBINDEX, TROPONINI in the last 168 hours. BNP: Invalid input(s): POCBNP CBG: No results for input(s): GLUCAP in the last 168 hours.    Disposition and Follow-up: Discharge Instructions    Diet - low sodium heart healthy   Complete by:  As directed    Increase activity slowly   Complete by:  As directed        DISPOSITION: Home   DISCHARGE FOLLOW-UP Follow-up Information    Susy Frizzle, MD. Schedule an appointment as soon as possible for a visit in 2 week(s).   Specialty:  Family Medicine Contact information: [place] [address] [place] [address] [phone]        Care, [facility] Follow up.   Why:  For home health PT. They will likely start services Monday and will be in contact with you through your home nuber to schedule your first home visit.  Contact information: [place] [address] [place] Follow up.   Why:  RW to be delivered to room prior to [place] information: [address] [place] [address] [phone]            Time coordinating discharge:  35 minutes  Signed:   Estill Cotta M.D. [facility] 12/05/2017, 2:20 PM Pager: [phone]

## 2017-12-05 NOTE — Progress Notes (Signed)
Patient discharge teaching given, including activity, diet, follow-up appoints, and medications. Patient verbalized understanding of all discharge instructions. IV access was d/c'd. Vitals are stable. Skin is intact except as charted in most recent assessments. Pt to be escorted out by NT, to be driven home by family. 

## 2017-12-05 NOTE — Evaluation (Signed)
Physical Therapy Evaluation Patient Details Name: Kyle Watkins MRN: [id number] DOB: 1941-07-24 Today's Date: 12/05/2017   History of Present Illness  Pt. is a 77 y.o. M with significant PMH of dementia who presented to ED after found to have abnormal labs at primary care office. Patient's wife reported he had multiple episodes of diarrhea 2 days prior to admission, a day before he had difficulty urinating. Admitted with Proctitis with SIRS and acute AKI.   Clinical Impression  Pt admitted with above diagnosis. Pt currently with functional limitations due to the deficits listed below (see PT Problem List). Patient has history of dementia at baseline. Lives with wife and patient daughter available for additional assistance. At baseline, patient is independent with mobility with no assistive device. However, upon evaluation, patient was very unsteady with ambulation with no additional support. Demonstrated improved balance and required less assistance with use of RW ambulating 150 feet in hallway. Recommended 24/7 supervision for mobility as well as HHPT for further strengthening and balance training to progress from RW. Pt will benefit from skilled PT to increase their independence and safety with mobility to allow discharge to the venue listed below.       Follow Up Recommendations Home health PT;Supervision for mobility/OOB    Equipment Recommendations  Rolling walker with 5" wheels    Recommendations for Other Services       Precautions / Restrictions Precautions Precautions: Fall Restrictions Weight Bearing Restrictions: No      Mobility  Bed Mobility Overal bed mobility: Needs Assistance Bed Mobility: Supine to Sit     Supine to sit: Supervision        Transfers Overall transfer level: Needs assistance Equipment used: Rolling walker (2 wheeled) Transfers: Sit to/from Stand Sit to Stand: Min guard         General transfer comment: Min guard for safety while PT  stabilized RW from bed and toilet.  Ambulation/Gait Ambulation/Gait assistance: Min guard;Min assist Ambulation Distance (Feet): 150 Feet Assistive device: Rolling walker (2 wheeled);None Gait Pattern/deviations: Step-through pattern;Decreased dorsiflexion - right;Decreased dorsiflexion - left     General Gait Details: Initially ambulated first 5 feet with no AD. Patient requiring minimal assist to steady for balance. Patient with improved balance using RW and required min guard assist. Hand over hand cueing for turns.   Stairs            Wheelchair Mobility    Modified Rankin (Stroke Patients Only)       Balance Overall balance assessment: Needs assistance Sitting-balance support: Feet supported;No upper extremity supported Sitting balance-Leahy Scale: Good     Standing balance support: No upper extremity supported;During functional activity Standing balance-Leahy Scale: Fair Standing balance comment: Requires additional support for dynamic balance                             Pertinent Vitals/Pain Pain Assessment: No/denies pain    Home Living Family/patient expects to be discharged to:: Private residence Living Arrangements: Spouse/significant other Available Help at Discharge: Family(Daughter) Type of Home: House Home Access: Stairs to enter Entrance Stairs-Rails: Can reach both Entrance Stairs-Number of Steps: 3 Home Layout: One level Home Equipment: Grab bars - tub/shower;Shower seat      Prior Function Level of Independence: Needs assistance   Gait / Transfers Assistance Needed: independent with no AD  ADL's / Homemaking Assistance Needed: Needs set up assistance for dressing and wife helps patient shave  Hand Dominance        Extremity/Trunk Assessment   Upper Extremity Assessment Upper Extremity Assessment: Overall WFL for tasks assessed    Lower Extremity Assessment Lower Extremity Assessment: Generalized weakness        Communication   Communication: No difficulties  Cognition Arousal/Alertness: Awake/alert Behavior During Therapy: WFL for tasks assessed/performed Overall Cognitive Status: History of cognitive impairments - at baseline                                 General Comments: Patient has history of dementia. Follows simple commands inconsistently.      General Comments General comments (skin integrity, edema, etc.): patient wife and daughter present throughout    Exercises     Assessment/Plan    PT Assessment Patient needs continued PT services  PT Problem List Decreased strength;Decreased activity tolerance;Decreased balance;Decreased mobility;Decreased coordination;Decreased cognition;Decreased knowledge of use of DME       PT Treatment Interventions DME instruction;Gait training;Stair training;Functional mobility training;Therapeutic activities;Therapeutic exercise;Balance training;Patient/family education    PT Goals (Current goals can be found in the Care Plan section)  Acute Rehab PT Goals Patient Stated Goal: Patient family wants him to not to rely on RW permanently PT Goal Formulation: With patient Time For Goal Achievement: 12/19/17 Potential to Achieve Goals: Good    Frequency Min 3X/week   Barriers to discharge        Co-evaluation               AM-PAC PT "6 Clicks" Daily Activity  Outcome Measure Difficulty turning over in bed (including adjusting bedclothes, sheets and blankets)?: None Difficulty moving from lying on back to sitting on the side of the bed? : A Little Difficulty sitting down on and standing up from a chair with arms (e.g., wheelchair, bedside commode, etc,.)?: A Little Help needed moving to and from a bed to chair (including a wheelchair)?: A Little Help needed walking in hospital room?: A Little Help needed climbing 3-5 steps with a railing? : A Little 6 Click Score: 19    End of Session Equipment Utilized During  Treatment: Gait belt Activity Tolerance: Patient tolerated treatment well Patient left: with family/visitor present;with call bell/phone within reach;in bed Nurse Communication: Mobility status PT Visit Diagnosis: Unsteadiness on feet (R26.81);Muscle weakness (generalized) (M62.81);Difficulty in walking, not elsewhere classified (R26.2)    Time: 1005-1030 PT Time Calculation (min) (ACUTE ONLY): 25 min   Charges:   PT Evaluation $PT Eval Moderate Complexity: 1 Mod PT Treatments $Therapeutic Activity: 8-22 mins   PT G Codes:        Ellamae Sia, PT, DPT Acute Rehabilitation Services  Pager: [place] 12/05/2017, 10:47 AM

## 2017-12-05 NOTE — Care Management Note (Signed)
Case Management Note  Patient Details  Name: Kyle Watkins MRN: [id number] Date of Birth: 10-06-1940  Subjective/Objective:                 Spoke w patient and wife at bedside. They would like to use [facility] for [facility] PT and would like a RW. Discussed other DME needs. They stated there were none. No other CM needs.    Action/Plan:   Expected Discharge Date:  12/05/17               Expected Discharge Plan:  [place]  In-House Referral:     Discharge planning Services  CM Consult  Post Acute Care Choice:  Durable Medical Equipment, Home Health Choice offered to:  Spouse  DME Arranged:  [place] Rile rolling DME Agency:  [place] Arranged:  PT [place]:  [place]  Status of Service:  Completed, signed off  If discussed at [place] of Stay Meetings, dates discussed:    Additional Comments:  Carles Collet, RN 12/05/2017, 11:35 AM

## 2017-12-05 NOTE — Consult Note (Signed)
[facility] CM Primary Care Navigator  12/05/2017  Kyle Watkins 03-28-1941 [id number]   Met withpatient and wife Kennyth Lose) at the bedsideto identify possible discharge needs. Wifereportsthat patient was having trouble urinating, had fever and impacted stool thathad ledto this admission. (Proctitis, acute renal failure, systemic inflammatory response syndrome)  Patient's wifeendorses Dr.Warren Pickard with [facility] as hisprimary care provider.   [facility] [place] [facility] [place]   Wife states that she has beenmanaginghismedications at home.  Patient's wife providestransportation to hisdoctors'appointments.  Patientand wife lives at home and wife serves as the primary caregiver for patient.  Anticipateddischarge planishomewith home health services per therapy recommendation.  Patientand wife voiced understanding to call primary care provider's officewhen hereturnshomefor a post discharge follow-upvisitwithin1- 2weeksor sooner if needs arise.Patient letter (with PCP's contact number) was provided asareminder.   Discussed with wife/ patientregarding [facility] CM services available for health management/ resources at home butshedeniesany needs or concerns for now. Wife states having good family support from daughters who can assist them when needed.  Patient's wifeverbalized understandingto seekreferral from primary care provider to [facility] care management if deemednecessaryand appropriate for anyservices in the future.  [facility] care management information was provided for future needs thathe may have.  Patient/ wife hadverbally agreedforEMMIcalls tofollowup hisrecoveryat home.  Referral made for [facility] General calls after discharge.   For additional questions please contact:  Edwena Felty A. Kalesha Irving, BSN, RN-BC [facility] PRIMARY CARE Navigator Cell: [phone]

## 2017-12-08 LAB — CULTURE, BLOOD (ROUTINE X 2)
CULTURE: NO GROWTH
Culture: NO GROWTH
Special Requests: ADEQUATE
Special Requests: ADEQUATE

## 2017-12-16 ENCOUNTER — Inpatient Hospital Stay: Payer: Medicare HMO | Admitting: Family Medicine

## 2017-12-19 ENCOUNTER — Encounter: Payer: Self-pay | Admitting: Family Medicine

## 2017-12-19 ENCOUNTER — Ambulatory Visit (INDEPENDENT_AMBULATORY_CARE_PROVIDER_SITE_OTHER): Payer: Medicare HMO | Admitting: Family Medicine

## 2017-12-19 VITALS — BP 110/76 | HR 59 | Temp 98.5°F | Ht 70.0 in | Wt 203.0 lb

## 2017-12-19 DIAGNOSIS — N179 Acute kidney failure, unspecified: Secondary | ICD-10-CM

## 2017-12-19 DIAGNOSIS — D72829 Elevated white blood cell count, unspecified: Secondary | ICD-10-CM | POA: Diagnosis not present

## 2017-12-19 DIAGNOSIS — Z09 Encounter for follow-up examination after completed treatment for conditions other than malignant neoplasm: Secondary | ICD-10-CM | POA: Diagnosis not present

## 2017-12-19 NOTE — Progress Notes (Signed)
Subjective:    Patient ID: Kyle Watkins, male    DOB: [birth year], 77 y.o.   MRN: [id number]  HPI  Patient was recently admitted to the hospital.  He was admitted after suffering a fecal impaction that led to dehydration and proctitis.  He was disimpacted in the office but then went to the hospital and his creatinine was found to be greater than 3.  I have copied relevant portions of his discharge summary and included them below for my reference:  Admit date: 12/02/2017 Discharge date: 12/05/2017  Primary Care Physician:  Susy Frizzle, MD   Recommendations for Outpatient Follow-up:  1. Follow up with PCP in 1-2 weeks 2. Please obtain BMP/CBC in one week  Home Health: Home health PT OT Equipment/Devices: Rolling walker  Discharge Condition: stable CODE STATUS: FULL Diet recommendation: Heart healthy diet   Discharge Diagnoses:    . SIRS (systemic inflammatory response syndrome) (HCC) . ARF (acute renal failure) ([place]) . Dementia . Fecal impaction with proctitis   Consults: None    Allergies:       Allergies  Allergen Reactions  . Keflex [Cephalexin] Itching and Rash     DISCHARGE MEDICATIONS:      Allergies as of 12/05/2017      Reactions   Keflex [cephalexin] Itching, Rash                     Medication List           TAKE these medications          aspirin EC 81 MG tablet Take 81 mg by mouth daily.   cholecalciferol 1000 units tablet Commonly known as:  VITAMIN D Take 1,000 Units by mouth daily.   ciprofloxacin 500 MG tablet Commonly known as:  CIPRO Take 1 tablet (500 mg total) by mouth 2 (two) times daily for 7 days. What changed:  when to take this   donepezil 10 MG tablet Commonly known as:  ARICEPT TAKE ONE TABLET BY MOUTH AT BEDTIME   loratadine 10 MG tablet Commonly known as:  CLARITIN Take 10 mg by mouth daily as needed for allergies.   memantine 10 MG tablet Commonly known as:  NAMENDA TAKE 1  TABLET BY MOUTH TWICE A DAY   metroNIDAZOLE 500 MG tablet Commonly known as:  FLAGYL Take 1 tablet (500 mg total) by mouth 3 (three) times daily for 7 days.   ondansetron 4 MG disintegrating tablet Commonly known as:  ZOFRAN ODT Take 1 tablet (4 mg total) by mouth every 8 (eight) hours as needed for nausea or vomiting.   polyethylene glycol packet Commonly known as:  MIRALAX Take 17 g by mouth daily as needed for mild constipation or moderate constipation (if no BM in 24 hours).   senna-docusate 8.6-50 MG tablet Commonly known as:  Senokot-S Take 1 tablet by mouth 2 (two) times daily.   VITAMIN B-12 PO Take 1 tablet by mouth daily.                                 Durable Medical Equipment  (From admission, onward)               Start     Ordered   12/05/17 1141  For home use only DME Walker rolling  Once    Question:  Patient needs a walker to treat with the following condition  Answer:  Weakness   12/05/17 1141       Brief H and P: For complete details please refer to admission H and P, but in briefPatient is a 77 year old male with a history of dementia, presented to ED after found to have abnormal labs at the primary care office. Patient's wife reported that he had multiple episodes of diarrhea 2 days prior to admission, a day before he had difficulty urinating. Patient also reported fever-like symptoms. Patient went to his PCP and was found to have fecal impaction, was disimpacted. Following that patient had significant amount of urine output. He was discharged home on ciprofloxacin. Patient was found to have acute renal insufficiency, leukocytosis and then was recommended to come to the ED. CT abdomen showed proctitis with mild eccentric and abdominal wall edema, creatinine 3.74, WBCs 23   Hospital Course:   Proctitiswith SIRS -Patient met SIRS criteria with tachycardia, mildly febrile, leukocytosis, lactic acidosis -CT abdomen  showed proctitis, -UA negative for UTI, blood cultures negative so far -Patient was placed on IV ciprofloxacin and Flagyl, transition to oral Cipro and Flagyl for 7 days. -Patient had fecal impaction prior to admission which led to proctitis, hence placed on stool softener and laxative, recommended fiber diet -Leukocytosis improved   Acute kidney injury:Likely due to obstruction from proctitis, dehydration from diarrhea -Placed on IV fluid hydration, creatinine improving -Creatinine 3.74 at the time of admission, baseline 1.2 -Improving, patient was placed on IV fluid hydration  Constipation -Placed on Senokot S twice daily, MiraLAX as needed  Dementia -currently at baseline  Lactic acidosis -Secondary to #1, dehydration, hypovolemia. -Lactic acid improved with IV fluid hydration  Home health PT OT, RN, DME rolling walker placed    Patient's creatinine at discharge was back down to 1.36.  His white blood cell count had fallen from greater than 20 on admission to just above 11 at discharge.  He is here today for follow-up.  He is coming by his wife.  Patient is noncommunicative.  He is ambulating with a walker.  Wife states that he is doing very well.  She states that he is going to the bathroom and having a bowel movement every day as long as she gets a MiraLAX.  He is making a normal amount of urine.  Physical therapy was recommended at the hospital however the patient's wife declined after getting home because he is doing so well.  I watch the patient ambulate in the hallways here.  He is using a walker but he has a normal steady gait.  He shows excellent balance.  He can easily stand from a seated position and sit without wavering or staggering and only using his legs.  Therefore I agree with her that I am not certain that he needs physical therapy. Past Medical History:  Diagnosis Date  . Dementia    Past Surgical History:  Procedure Laterality Date  . Arm surgery      . MOUTH SURGERY    . SKIN GRAFT     Current Outpatient Medications on File Prior to Visit  Medication Sig Dispense Refill  . aspirin EC 81 MG tablet Take 81 mg by mouth daily.    . cholecalciferol (VITAMIN D) 1000 units tablet Take 1,000 Units by mouth daily.    . Cyanocobalamin (VITAMIN B-12 PO) Take 1 tablet by mouth daily.    Marland Kitchen donepezil (ARICEPT) 10 MG tablet TAKE ONE TABLET BY MOUTH AT BEDTIME 90 tablet 3  . loratadine (CLARITIN) 10 MG tablet Take  10 mg by mouth daily as needed for allergies.    . memantine (NAMENDA) 10 MG tablet TAKE 1 TABLET BY MOUTH TWICE A DAY 60 tablet 11  . ondansetron (ZOFRAN ODT) 4 MG disintegrating tablet Take 1 tablet (4 mg total) by mouth every 8 (eight) hours as needed for nausea or vomiting. 20 tablet 0  . polyethylene glycol (MIRALAX) packet Take 17 g by mouth daily as needed for mild constipation or moderate constipation (if no BM in 24 hours). 30 each 3  . senna-docusate (SENOKOT-S) 8.6-50 MG tablet Take 1 tablet by mouth 2 (two) times daily. 60 tablet 3   No current facility-administered medications on file prior to visit.    Allergies  Allergen Reactions  . Keflex [Cephalexin] Itching and Rash   Social History   Socioeconomic History  . Marital status: Married    Spouse name: Not on file  . Number of children: Not on file  . Years of education: Not on file  . Highest education level: Not on file  Occupational History  . Not on file  Social Needs  . Financial resource strain: Not on file  . Food insecurity:    Worry: Not on file    Inability: Not on file  . Transportation needs:    Medical: Not on file    Non-medical: Not on file  Tobacco Use  . Smoking status: Former Smoker    Types: Cigarettes    Last attempt to quit: 02/22/1959    Years since quitting: 58.8  . Smokeless tobacco: Never Used  Substance and Sexual Activity  . Alcohol use: No  . Drug use: No  . Sexual activity: Not on file  Lifestyle  . Physical activity:     Days per week: Not on file    Minutes per session: Not on file  . Stress: Not on file  Relationships  . Social connections:    Talks on phone: Not on file    Gets together: Not on file    Attends religious service: Not on file    Active member of club or organization: Not on file    Attends meetings of clubs or organizations: Not on file    Relationship status: Not on file  . Intimate partner violence:    Fear of current or ex partner: Not on file    Emotionally abused: Not on file    Physically abused: Not on file    Forced sexual activity: Not on file  Other Topics Concern  . Not on file  Social History Narrative  . Not on file     Review of Systems  All other systems reviewed and are negative.      Objective:   Physical Exam  Constitutional: He appears well-developed and well-nourished.  Eyes: Pupils are equal, round, and reactive to light. Conjunctivae are normal.  Neck: No JVD present. No thyromegaly present.  Cardiovascular: Normal rate, regular rhythm and normal heart sounds.  Pulmonary/Chest: Effort normal and breath sounds normal. No stridor. No respiratory distress. He has no wheezes.  Abdominal: Soft. Bowel sounds are normal. He exhibits no distension and no mass. There is no tenderness. There is no rebound and no guarding.  Musculoskeletal: He exhibits no edema.  Lymphadenopathy:    He has no cervical adenopathy.  Vitals reviewed.         Assessment & Plan:  Hospital discharge follow-up - Plan: CBC with Differential/Platelet, COMPLETE METABOLIC PANEL WITH GFR Acute kidney injury secondary to obstructive uropathy  and prerenal azotemia Proctitis secondary to fecal impaction Leukocytosis secondary to proctitis  I reviewed his hospital records and reconciled his medication list.  I will repeat a CMP to ensure that there is been complete total resolution of his prerenal azotemia.  I believe the patient had dehydration coupled with obstructive uropathy  causing his acute kidney injury.  We will reassess his renal function today.  I believe his white blood cell count was secondary to Sirs, prostatitis, and proctitis.  He is completed Cipro and Flagyl.  I will repeat a CBC today to ensure resolution of his leukocytosis.  Regarding weakness and deconditioning, the patient's strength seems to be back to his baseline.  He is ambulating well without much difficulty as long as he is using a walker.  Both the wife and I believe the patient does not seem to benefit from physical therapy at home and therefore I agree with her decision not to pursue this.  Unfortunately his dementia his prognosis remains guarded.  I anticipate that this will likely only worsen with time.  I recommended that he use MiraLAX every day to help prevent future fecal impactions.  Discontinue MiraLAX only if patient shows diarrhea

## 2017-12-20 LAB — CBC WITH DIFFERENTIAL/PLATELET
BASOS ABS: 41 {cells}/uL (ref 0–200)
Basophils Relative: 0.4 %
EOS ABS: 350 {cells}/uL (ref 15–500)
EOS PCT: 3.4 %
HCT: 40.3 % (ref 38.5–50.0)
Hemoglobin: 13.9 g/dL (ref 13.2–17.1)
Lymphs Abs: 1936 cells/uL (ref 850–3900)
MCH: 29.5 pg (ref 27.0–33.0)
MCHC: 34.5 g/dL (ref 32.0–36.0)
MCV: 85.6 fL (ref 80.0–100.0)
MONOS PCT: 10.2 %
MPV: 10.2 fL (ref 7.5–12.5)
NEUTROS ABS: 6922 {cells}/uL (ref 1500–7800)
NEUTROS PCT: 67.2 %
PLATELETS: 446 10*3/uL — AB (ref 140–400)
RBC: 4.71 10*6/uL (ref 4.20–5.80)
RDW: 13 % (ref 11.0–15.0)
Total Lymphocyte: 18.8 %
WBC: 10.3 10*3/uL (ref 3.8–10.8)
WBCMIX: 1051 {cells}/uL — AB (ref 200–950)

## 2017-12-20 LAB — COMPLETE METABOLIC PANEL WITHOUT GFR
AG Ratio: 1.1 (calc) (ref 1.0–2.5)
ALT: 14 U/L (ref 9–46)
AST: 17 U/L (ref 10–35)
Albumin: 3.6 g/dL (ref 3.6–5.1)
Alkaline phosphatase (APISO): 51 U/L (ref 40–115)
BUN/Creatinine Ratio: 17 (calc) (ref 6–22)
BUN: 20 mg/dL (ref 7–25)
CO2: 28 mmol/L (ref 20–32)
Calcium: 8.9 mg/dL (ref 8.6–10.3)
Chloride: 101 mmol/L (ref 98–110)
Creat: 1.2 mg/dL — ABNORMAL HIGH (ref 0.70–1.18)
GFR, Est African American: 67 mL/min/1.73m2
GFR, Est Non African American: 58 mL/min/1.73m2 — ABNORMAL LOW
Globulin: 3.2 g/dL (ref 1.9–3.7)
Glucose, Bld: 87 mg/dL (ref 65–99)
Potassium: 4.6 mmol/L (ref 3.5–5.3)
Sodium: 139 mmol/L (ref 135–146)
Total Bilirubin: 0.4 mg/dL (ref 0.2–1.2)
Total Protein: 6.8 g/dL (ref 6.1–8.1)

## 2018-05-28 ENCOUNTER — Other Ambulatory Visit: Payer: Self-pay | Admitting: Family Medicine

## 2018-05-28 MED ORDER — SENNOSIDES-DOCUSATE SODIUM 8.6-50 MG PO TABS: 1.0000 | ORAL_TABLET | Freq: Two times a day (BID) | ORAL | 3 refills | Status: AC

## 2018-08-10 ENCOUNTER — Ambulatory Visit (INDEPENDENT_AMBULATORY_CARE_PROVIDER_SITE_OTHER): Payer: Medicare HMO

## 2018-08-10 DIAGNOSIS — Z23 Encounter for immunization: Secondary | ICD-10-CM | POA: Diagnosis not present

## 2018-08-10 NOTE — Progress Notes (Signed)
Patient came in today to receive annual flu shot. High dose fluzone was given in the left deltoid. Patient tolerated well. VIS given.

## 2018-10-07 ENCOUNTER — Other Ambulatory Visit: Payer: Self-pay | Admitting: Family Medicine

## 2018-12-22 ENCOUNTER — Other Ambulatory Visit: Payer: Self-pay | Admitting: Family Medicine

## 2018-12-22 NOTE — Telephone Encounter (Signed)
Requested Prescriptions   Pending Prescriptions Disp Refills  . donepezil (ARICEPT) 10 MG tablet [Pharmacy Med Name: DONEPEZIL HCL 10 MG TAB] 90 tablet 3    Sig: TAKE 1 TABLET BY MOUTH AT BEDTIME   Last OV 12/19/2017

## 2019-01-01 ENCOUNTER — Other Ambulatory Visit: Payer: Self-pay | Admitting: Family Medicine

## 2019-01-05 ENCOUNTER — Encounter: Payer: Self-pay | Admitting: *Deleted

## 2019-01-05 ENCOUNTER — Telehealth: Payer: Self-pay | Admitting: *Deleted

## 2019-01-05 DIAGNOSIS — M130 Polyarthritis, unspecified: Secondary | ICD-10-CM | POA: Insufficient documentation

## 2019-01-05 MED ORDER — DICLOFENAC SODIUM 1 % TD GEL: TRANSDERMAL | 3 refills | Status: DC

## 2019-01-05 NOTE — Telephone Encounter (Signed)
[employer] has not yet replied to your PA request. You may close this dialog, return to your dashboard, and perform other tasks.  To check for an update later, open this request again from your dashboard.  If [employer] has not replied to your request within 24 hours please contact [employer] at [phone]

## 2019-01-05 NOTE — Telephone Encounter (Signed)
Received  PA determination.   PA Case: [id number] Approved12/30/2019- 07/29/2019.

## 2019-01-05 NOTE — Telephone Encounter (Signed)
Received request from pharmacy for PA on Voltaren Gel.   PA submitted.   Dx: M13- polyarthritis.

## 2019-01-18 ENCOUNTER — Ambulatory Visit (INDEPENDENT_AMBULATORY_CARE_PROVIDER_SITE_OTHER): Payer: Medicare HMO | Admitting: Family Medicine

## 2019-01-18 ENCOUNTER — Other Ambulatory Visit: Payer: Self-pay

## 2019-01-18 ENCOUNTER — Encounter: Payer: Self-pay | Admitting: Family Medicine

## 2019-01-18 VITALS — BP 100/62 | HR 56 | Temp 97.8°F | Resp 12 | Ht 70.0 in | Wt 195.0 lb

## 2019-01-18 DIAGNOSIS — F0390 Unspecified dementia without behavioral disturbance: Secondary | ICD-10-CM | POA: Diagnosis not present

## 2019-01-18 DIAGNOSIS — R69 Illness, unspecified: Secondary | ICD-10-CM | POA: Diagnosis not present

## 2019-01-18 NOTE — Progress Notes (Signed)
Subjective:    Patient ID: Kyle Watkins, male    DOB: 1940/12/10, 78 y.o.   MRN: [id number]  HPI 11/2017 Patient was recently admitted to the hospital.  He was admitted after suffering a fecal impaction that led to dehydration and proctitis.  He was disimpacted in the office but then went to the hospital and his creatinine was found to be greater than 3.  Patient's creatinine at discharge was back down to 1.36.  His white blood cell count had fallen from greater than 20 on admission to just above 11 at discharge.  He is here today for follow-up.  He is accompanied  by his wife.  Patient is noncommunicative.  He is ambulating with a walker.  Wife states that he is doing very well.  She states that he is going to the bathroom and having a bowel movement every day as long as she gets a MiraLAX.  He is making a normal amount of urine.  Physical therapy was recommended at the hospital however the patient's wife declined after getting home because he is doing so well.  I watch the patient ambulate in the hallways here.  He is using a walker but he has a normal steady gait.  He shows excellent balance.  He can easily stand from a seated position and sit without wavering or staggering and only using his legs.  Therefore I agree with her that I am not certain that he needs physical therapy.  At that time, my plan was: I reviewed his hospital records and reconciled his medication list.  I will repeat a CMP to ensure that there is been complete total resolution of his prerenal azotemia.  I believe the patient had dehydration coupled with obstructive uropathy causing his acute kidney injury.  We will reassess his renal function today.  I believe his white blood cell count was secondary to Sirs, prostatitis, and proctitis.  He is completed Cipro and Flagyl.  I will repeat a CBC today to ensure resolution of his leukocytosis.  Regarding weakness and deconditioning, the patient's strength seems to be back to his  baseline.  He is ambulating well without much difficulty as long as he is using a walker.  Both the wife and I believe the patient does not seem to benefit from physical therapy at home and therefore I agree with her decision not to pursue this.  Unfortunately, due to his dementia, his prognosis remains guarded.  I anticipate that this will likely only worsen with time.  I recommended that he use MiraLAX every day to help prevent future fecal impactions.  Discontinue MiraLAX only if patient shows diarrhea  01/18/19 I have not seen the patient since May of last year.  He is here today with his family.  They are requesting a lift chair to facilitate their ability to care for the patient at home.  His dementia has progressed.  The patient is now having a difficult time even rising from a seated position.  This makes it extremely difficult to go to the restroom and to provide for his ADLs.  His wife, due to advancing age, is unable to pull him to a standing position from a seated position.  Therefore a lift chair would be greatly beneficial in helping her to help him stand in a safe manner preventing falls and injuries both to the patient and his wife.  I performed a stand up and go test on the patient.  He is noncommunicative.  When  I asked him to stand, he blankly stares at me.  When I start to pull gently on his arms he starts to stand up, he pauses mid stance, then tries to sit back down as if confused.  His wife states that he does this frequently.  He will try to pull on her to help pull himself to a stand.  She is essentially trying to lift dead weight.  They also need a handicap placard for the car.  They already have a shower chair and grab bars in the bathroom.  She does not need a wheelchair.  She does not need a hospital bed.  He is battling constipation Past Medical History:  Diagnosis Date  . Dementia    Past Surgical History:  Procedure Laterality Date  . Arm surgery     . MOUTH SURGERY    .  SKIN GRAFT     Current Outpatient Medications on File Prior to Visit  Medication Sig Dispense Refill  . aspirin EC 81 MG tablet Take 81 mg by mouth daily.    . cholecalciferol (VITAMIN D) 1000 units tablet Take 1,000 Units by mouth daily.    . Cyanocobalamin (VITAMIN B-12 PO) Take 1 tablet by mouth daily.    . diclofenac sodium (VOLTAREN) 1 % GEL APPLY 2 G TOPICALLY 4 TIMES DAILY. Requires office visit before any further refills can be given. 100 g 3  . donepezil (ARICEPT) 10 MG tablet TAKE 1 TABLET BY MOUTH AT BEDTIME 90 tablet 3  . loratadine (CLARITIN) 10 MG tablet Take 10 mg by mouth daily as needed for allergies.    . memantine (NAMENDA) 10 MG tablet TAKE 1 TABLET BY MOUTH TWICE A DAY 60 tablet 11  . ondansetron (ZOFRAN ODT) 4 MG disintegrating tablet Take 1 tablet (4 mg total) by mouth every 8 (eight) hours as needed for nausea or vomiting. 20 tablet 0  . polyethylene glycol (MIRALAX) packet Take 17 g by mouth daily as needed for mild constipation or moderate constipation (if no BM in 24 hours). 30 each 3  . senna-docusate (SENOKOT-S) 8.6-50 MG tablet Take 1 tablet by mouth 2 (two) times daily. 180 tablet 3   No current facility-administered medications on file prior to visit.    Allergies  Allergen Reactions  . Keflex [Cephalexin] Itching and Rash   Social History   Socioeconomic History  . Marital status: Married    Spouse name: Not on file  . Number of children: Not on file  . Years of education: Not on file  . Highest education level: Not on file  Occupational History  . Not on file  Social Needs  . Financial resource strain: Not on file  . Food insecurity    Worry: Not on file    Inability: Not on file  . Transportation needs    Medical: Not on file    Non-medical: Not on file  Tobacco Use  . Smoking status: Former Smoker    Types: Cigarettes    Quit date: 02/22/1959    Years since quitting: 59.9  . Smokeless tobacco: Never Used  Substance and Sexual Activity   . Alcohol use: No  . Drug use: No  . Sexual activity: Not on file  Lifestyle  . Physical activity    Days per week: Not on file    Minutes per session: Not on file  . Stress: Not on file  Relationships  . Social connections    Talks on phone: Not on file  Gets together: Not on file    Attends religious service: Not on file    Active member of club or organization: Not on file    Attends meetings of clubs or organizations: Not on file    Relationship status: Not on file  . Intimate partner violence    Fear of current or ex partner: Not on file    Emotionally abused: Not on file    Physically abused: Not on file    Forced sexual activity: Not on file  Other Topics Concern  . Not on file  Social History Narrative  . Not on file     Review of Systems  All other systems reviewed and are negative.      Objective:   Physical Exam Vitals signs reviewed.  Constitutional:      Appearance: He is well-developed.  Eyes:     Conjunctiva/sclera: Conjunctivae normal.     Pupils: Pupils are equal, round, and reactive to light.  Neck:     Thyroid: No thyromegaly.     Vascular: No JVD.  Cardiovascular:     Rate and Rhythm: Normal rate and regular rhythm.     Heart sounds: Normal heart sounds.  Pulmonary:     Effort: Pulmonary effort is normal. No respiratory distress.     Breath sounds: Normal breath sounds. No stridor. No wheezing.  Abdominal:     General: Bowel sounds are normal. There is no distension.     Palpations: Abdomen is soft. There is no mass.     Tenderness: There is no abdominal tenderness. There is no guarding or rebound.  Lymphadenopathy:     Cervical: No cervical adenopathy.           Assessment & Plan:  The encounter diagnosis was Dementia without behavioral disturbance, unspecified dementia type ([place]). Patient would benefit from a lift chair.  His dementia has progressed to the point that he does not know how to help his wife get him out of a chair.   He frequently pulls on her which causes her to lose her balance.  This going to cause her to fall or injure herself.  With a lift chair she could help him to a standing position so that he can go to bed or so that he can go to the bathroom.  Once standing, he is able to walk with a cane or a walker however he definitely needs a lift chair to help prevent unintended falls and hospitalizations

## 2019-02-07 IMAGING — DX DG CHEST 2V
2 series · 2 of 2 positions shown · non-contrast
Comparison: None.

CLINICAL DATA: Chest pain and shortness of breath

EXAM:
CHEST  2 VIEW

[chest lat]
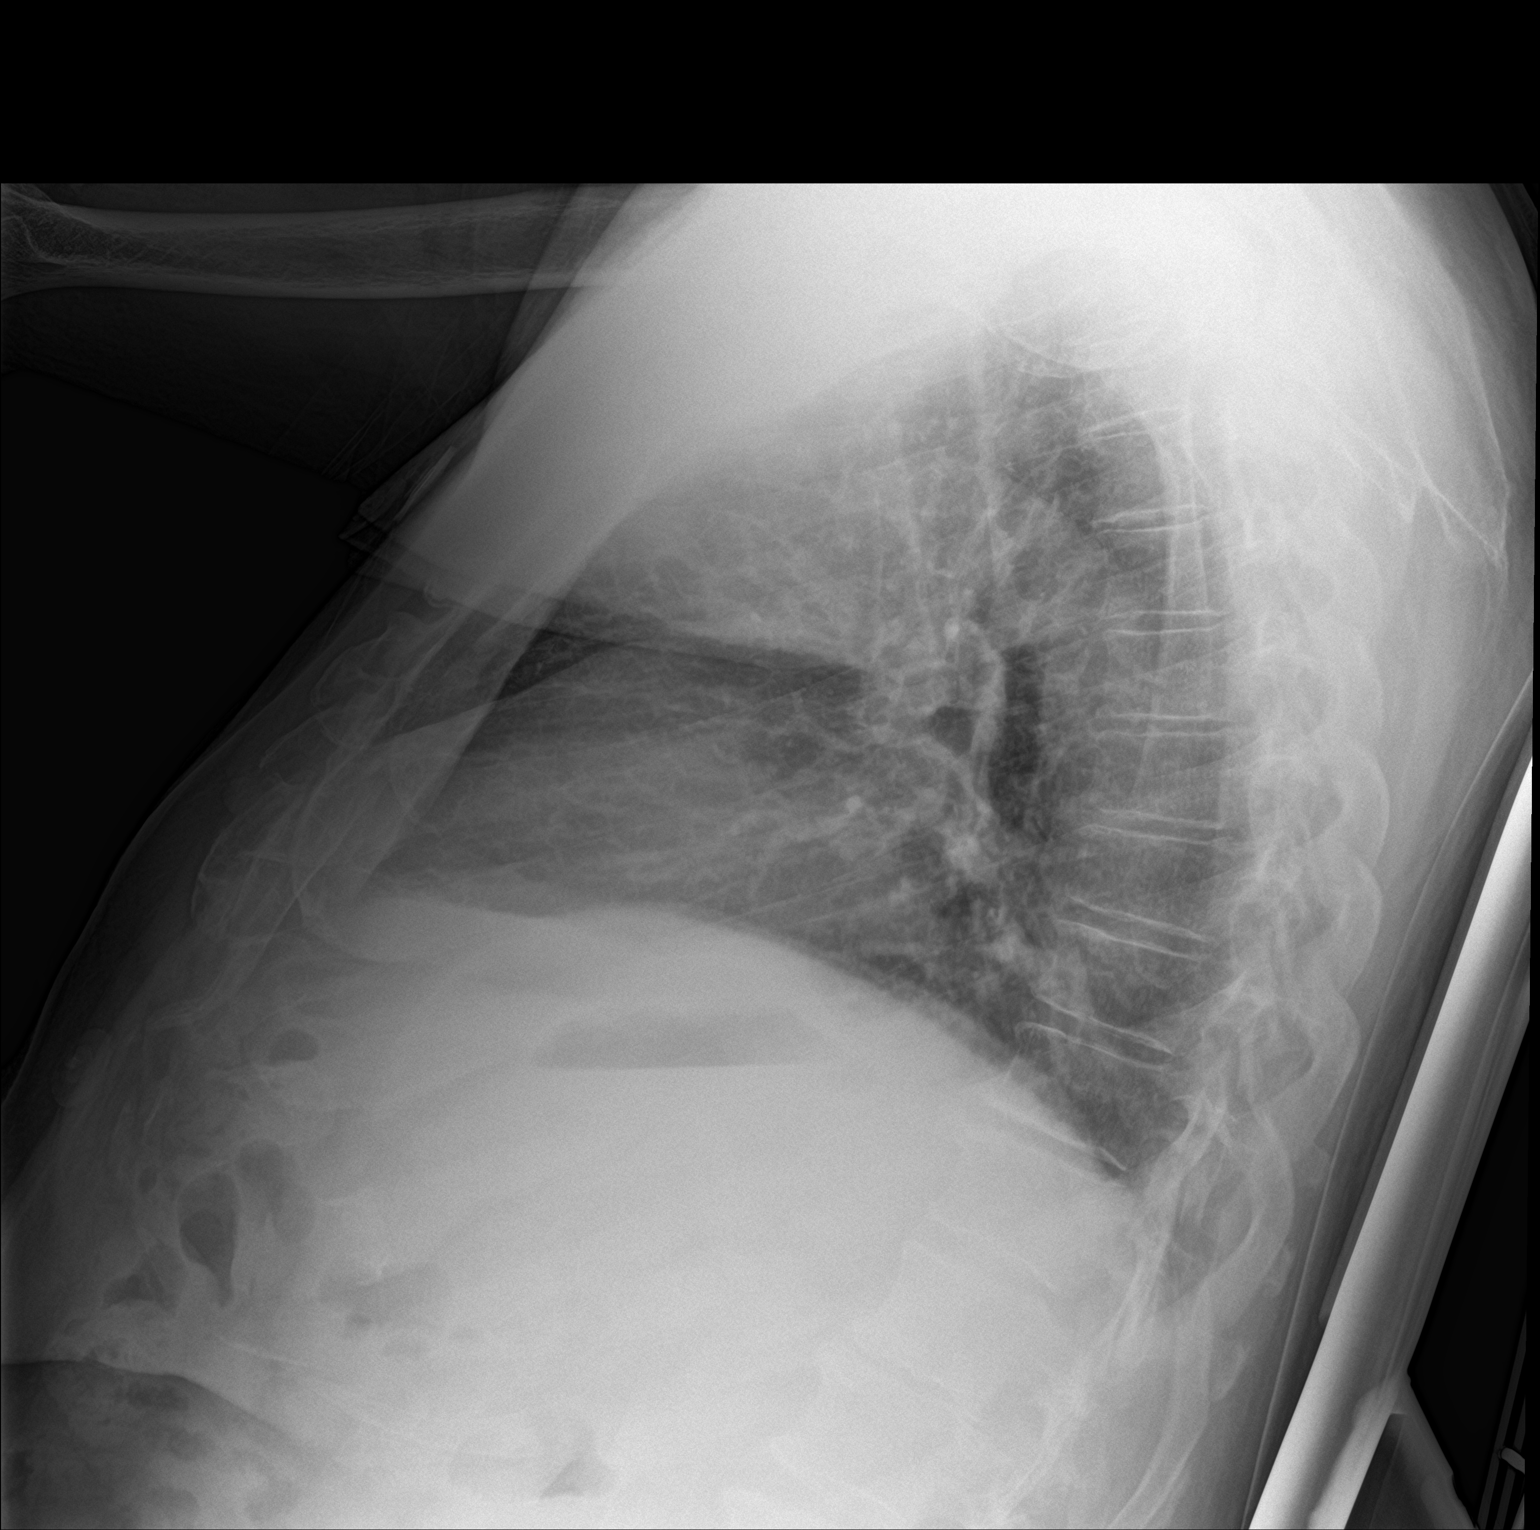

[chest ap]
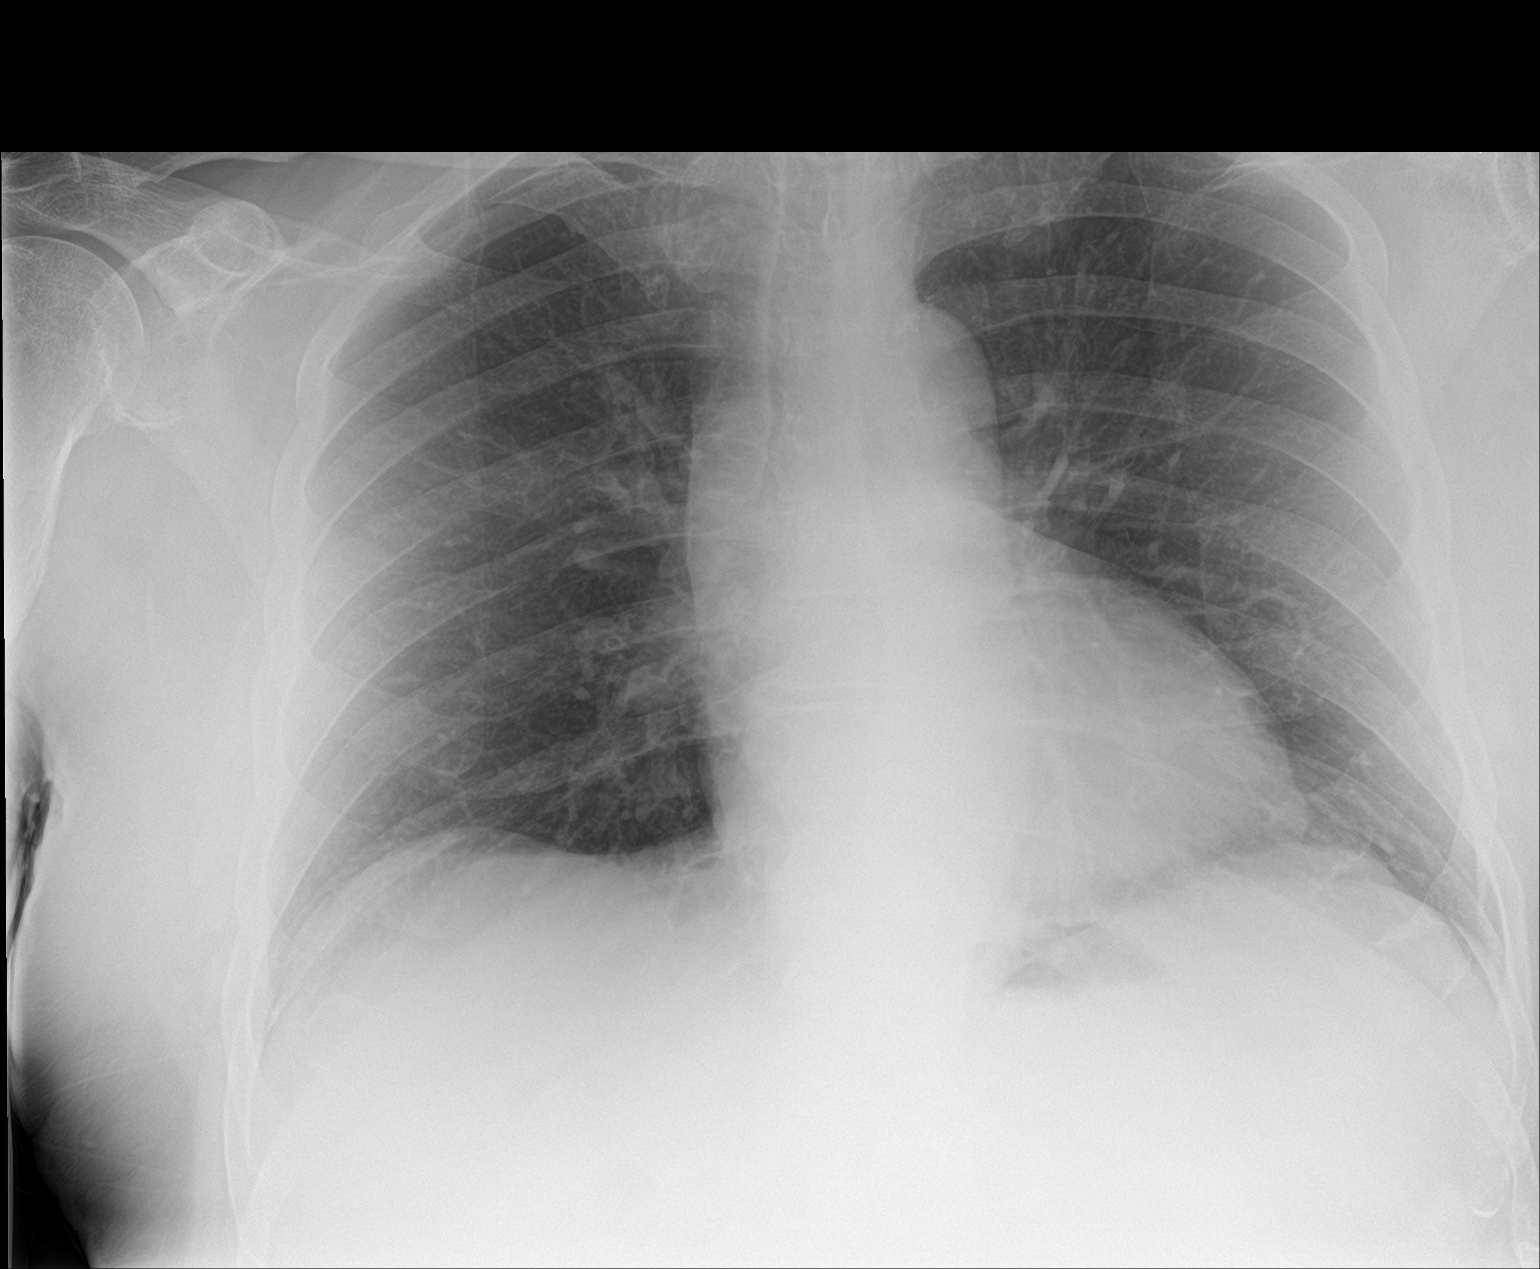

[2 of 2 positions shown; findings below may reference images not displayed]

FINDINGS: Lungs are clear. Heart size and pulmonary vascularity are normal. No
adenopathy. No bone lesions evident. No pneumothorax. There is
aortic atherosclerosis.
IMPRESSION: No edema or consolidation.  There is aortic atherosclerosis.

Aortic Atherosclerosis (OV666-TEM.M).

## 2019-03-25 ENCOUNTER — Other Ambulatory Visit: Payer: Self-pay

## 2019-03-25 DIAGNOSIS — R6889 Other general symptoms and signs: Secondary | ICD-10-CM | POA: Diagnosis not present

## 2019-03-25 DIAGNOSIS — Z20822 Contact with and (suspected) exposure to covid-19: Secondary | ICD-10-CM

## 2019-03-26 LAB — NOVEL CORONAVIRUS, NAA: SARS-CoV-2, NAA: NOT DETECTED

## 2019-09-10 IMAGING — CT CT ABD-PELV W/O CM
2 of 4 series · 16 of 46 positions shown, 18 images · non-contrast
Comparison: None.

CLINICAL DATA: 77-year-old male with abdominal distention and
diarrhea.

EXAM:
CT ABDOMEN AND PELVIS WITHOUT CONTRAST
TECHNIQUE: Multidetector CT imaging of the abdomen and pelvis was performed
following the standard protocol without IV contrast.

[Series 3: a/p w/o 5mm · axial · non-contrast · 0.87mm/px · z∈[+874,+1319]mm · 13 of 97 slices shown, 15 images]
[im 4/97  soft-tissue]
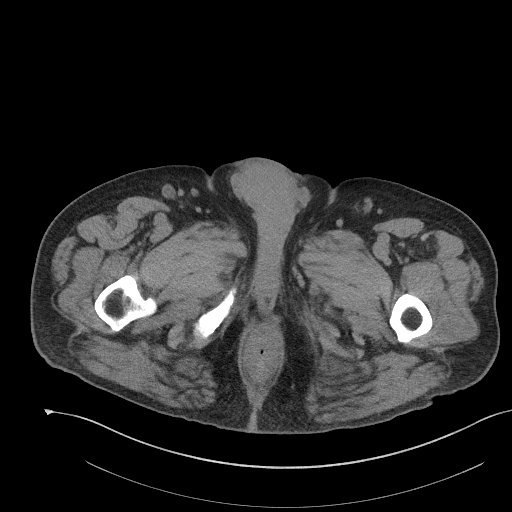
[im 4/97  bone]
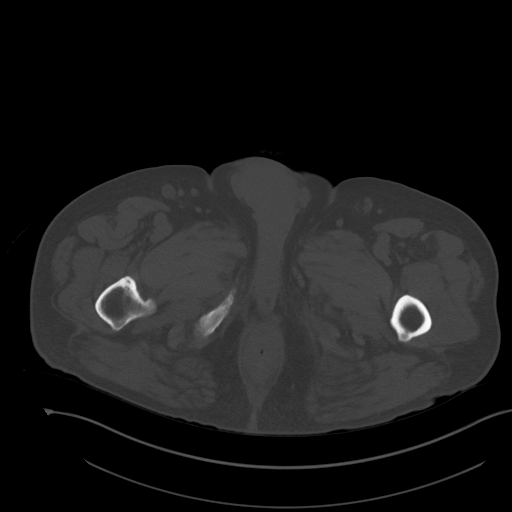
[im 12/97  soft-tissue]
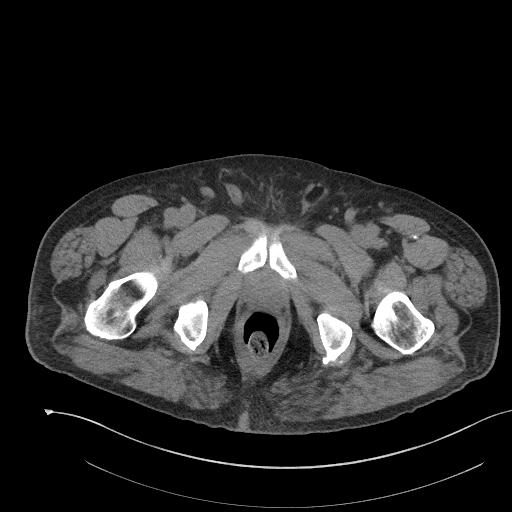
[im 19/97  soft-tissue]
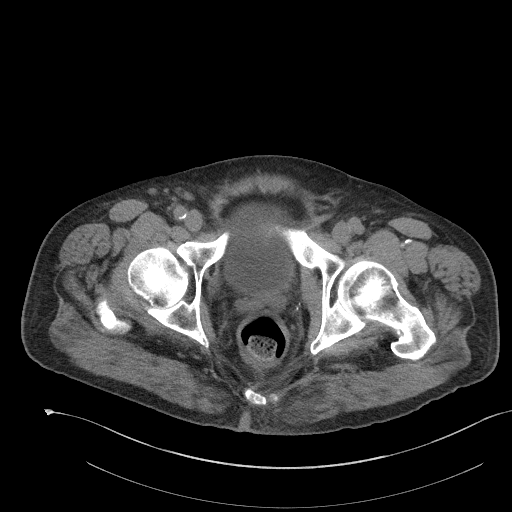
[im 26/97  soft-tissue]
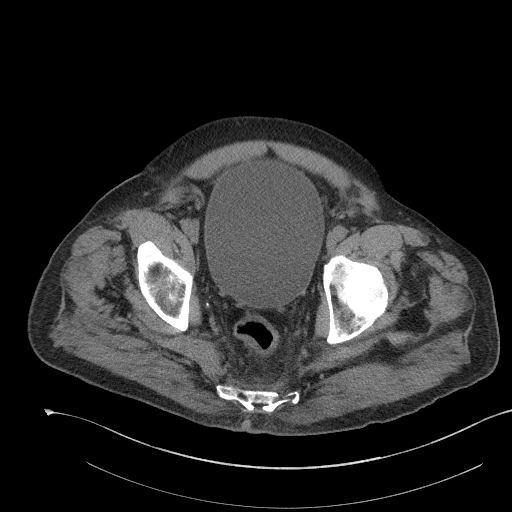
[im 34/97  soft-tissue]
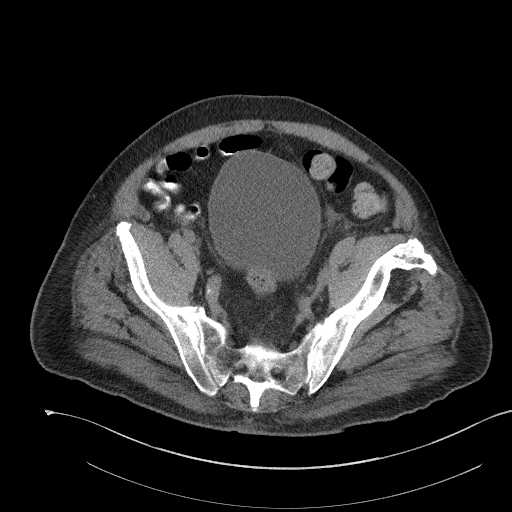
[im 41/97  soft-tissue]
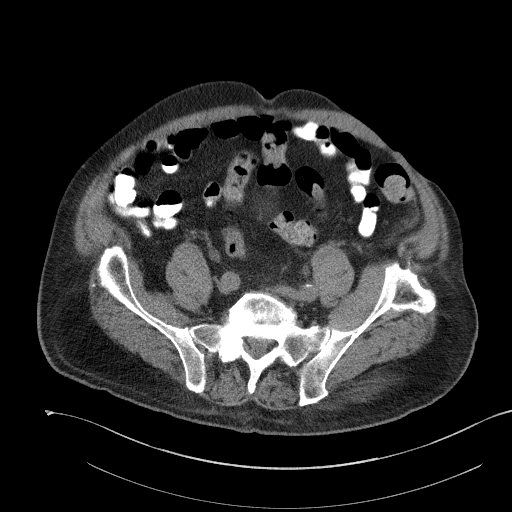
[im 49/97  soft-tissue]
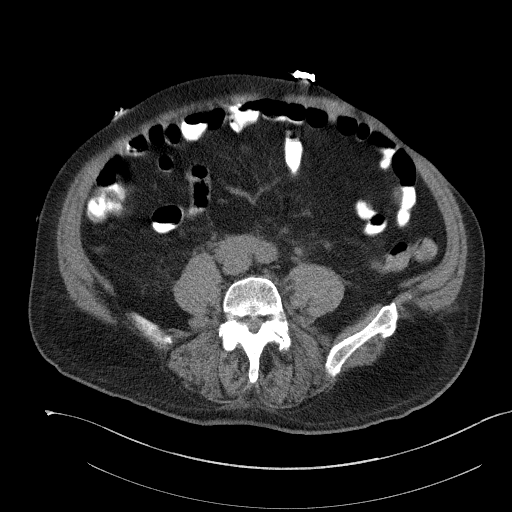
[im 56/97  soft-tissue]
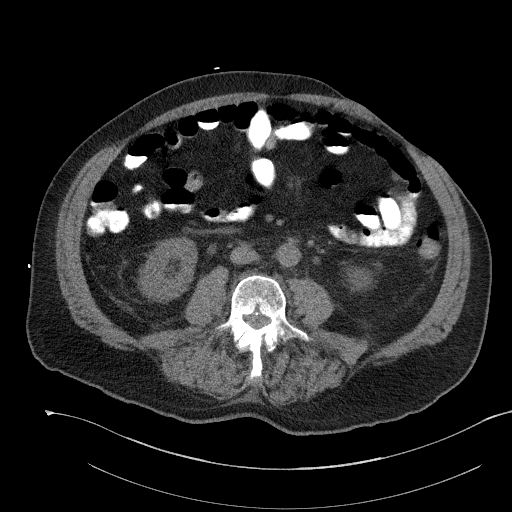
[im 63/97  soft-tissue]
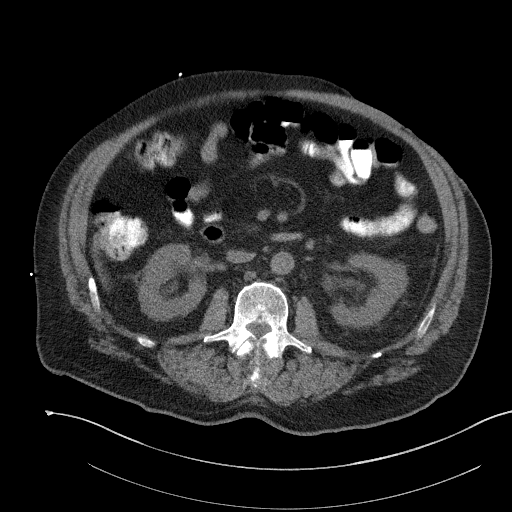
[im 63/97  bone]
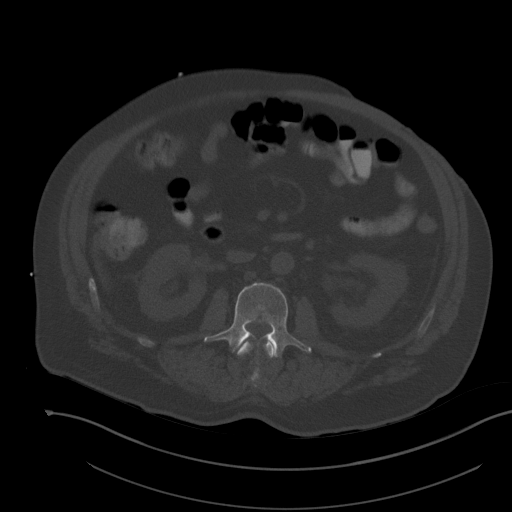
[im 71/97  soft-tissue]
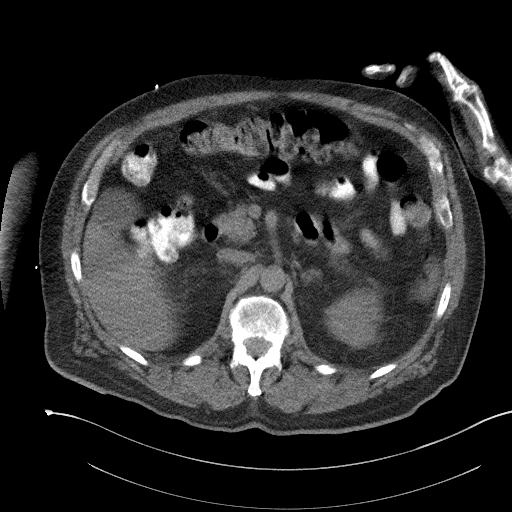
[im 78/97  soft-tissue]
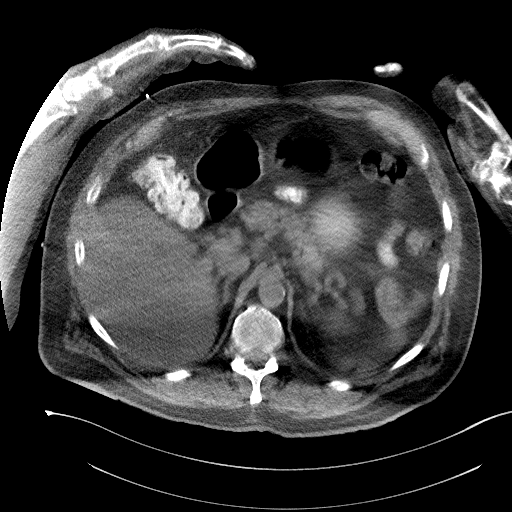
[im 85/97  soft-tissue]
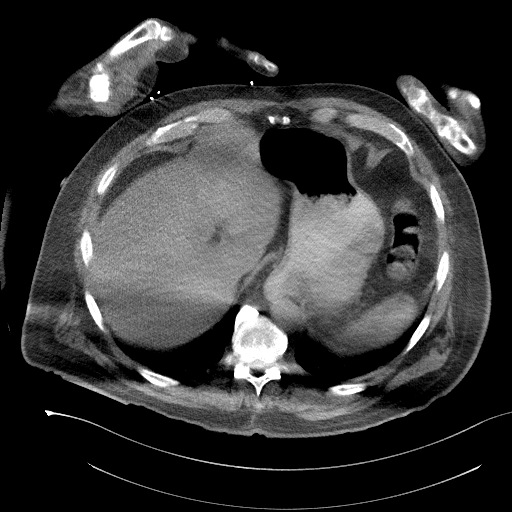
[im 93/97  soft-tissue]
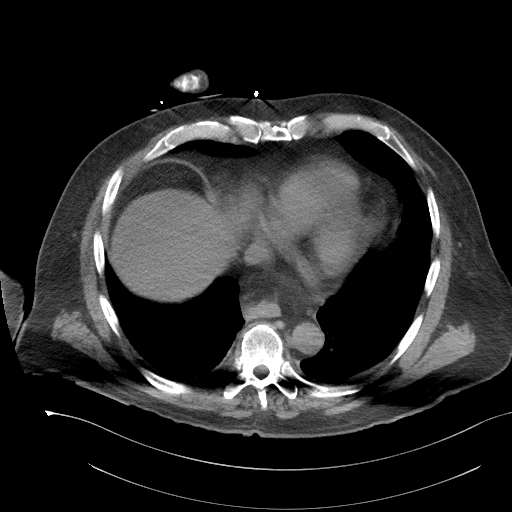

[Series 6: a/p w/o cor · coronal · non-contrast · 0.91mm/px · 3 of 156 slices shown]
[im 52/156  soft-tissue]
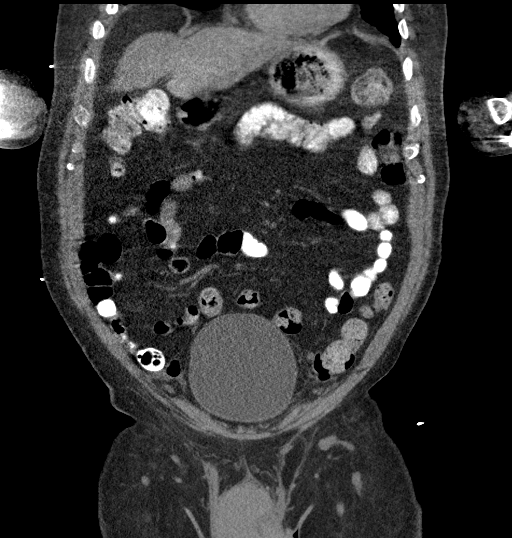
[im 69/156  soft-tissue]
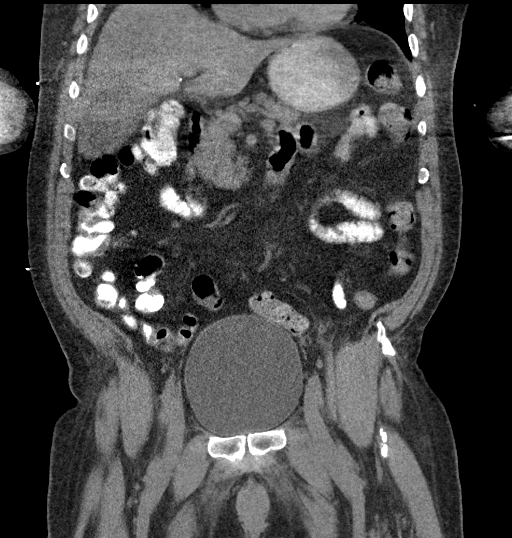
[im 87/156  soft-tissue]
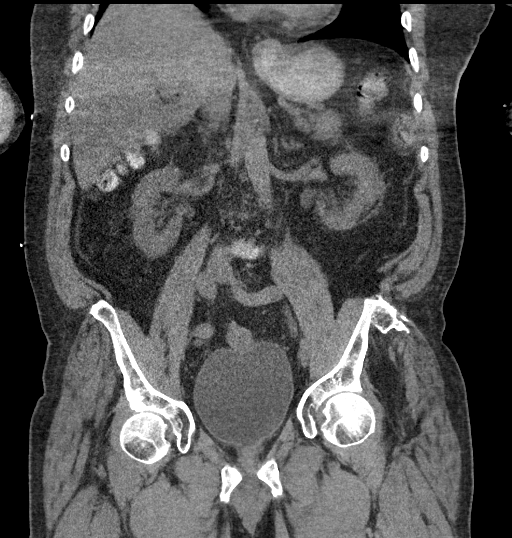

[16 of 46 positions shown; findings below may reference images not displayed]

FINDINGS: Evaluation of this exam is limited in the absence of intravenous
contrast.

Lower chest: The visualized lung bases are clear.

No intra-abdominal free air or free fluid.

Hepatobiliary: Probable fatty infiltration of the liver. No
intrahepatic biliary ductal dilatation. No calcified gallstone.

Pancreas: Unremarkable. No pancreatic ductal dilatation or
surrounding inflammatory changes.

Spleen: Normal in size without focal abnormality.

Adrenals/Urinary Tract: The adrenal glands are unremarkable. There
is mild fullness of the renal collecting systems bilaterally. No
stone. The visualized ureters appear unremarkable. The urinary
bladder is distended appears unremarkable.

Stomach/Bowel: There is a small hiatal hernia. There is no bowel
obstruction. There is mild circumferential thickening of the rectal
wall concerning for proctitis. Clinical correlation is recommended.
The appendix is normal.

Vascular/Lymphatic: Mild aortoiliac atherosclerotic disease. No
portal venous gas. There is no adenopathy.

Reproductive: The prostate and seminal vesicles are grossly
unremarkable.

Other: There is mild diffuse mesenteric and abdominal wall edema.

Musculoskeletal: Degenerative changes of the spine with multilevel
disc desiccation and vacuum phenomena. Chronic changes and
irregularity of the left pelvic bone. No acute fracture.
IMPRESSION: 1. Findings suspicious for proctitis. Clinical correlation is
recommended. No bowel obstruction. Normal appendix.
2. Mild fullness of the renal collecting system.  No stone.
3. Mild mesenteric and subcutaneous soft tissue edema.

## 2019-11-03 ENCOUNTER — Other Ambulatory Visit: Payer: Self-pay | Admitting: Family Medicine

## 2019-11-11 ENCOUNTER — Ambulatory Visit (INDEPENDENT_AMBULATORY_CARE_PROVIDER_SITE_OTHER): Payer: Medicare HMO | Admitting: Family Medicine

## 2019-11-11 ENCOUNTER — Other Ambulatory Visit: Payer: Self-pay

## 2019-11-11 ENCOUNTER — Encounter: Payer: Self-pay | Admitting: Family Medicine

## 2019-11-11 VITALS — BP 122/64 | HR 72 | Temp 96.7°F | Resp 12 | Ht 70.0 in | Wt 186.0 lb

## 2019-11-11 DIAGNOSIS — Z0000 Encounter for general adult medical examination without abnormal findings: Secondary | ICD-10-CM | POA: Diagnosis not present

## 2019-11-11 DIAGNOSIS — N183 Chronic kidney disease, stage 3 unspecified: Secondary | ICD-10-CM | POA: Diagnosis not present

## 2019-11-11 DIAGNOSIS — F0390 Unspecified dementia without behavioral disturbance: Secondary | ICD-10-CM

## 2019-11-11 DIAGNOSIS — F0150 Vascular dementia without behavioral disturbance: Secondary | ICD-10-CM

## 2019-11-11 DIAGNOSIS — Z0001 Encounter for general adult medical examination with abnormal findings: Secondary | ICD-10-CM

## 2019-11-11 MED ORDER — LINACLOTIDE 145 MCG PO CAPS: 145.0000 ug | ORAL_CAPSULE | Freq: Every day | ORAL | 3 refills | Status: DC

## 2019-11-11 NOTE — Progress Notes (Signed)
Subjective:    Patient ID: Kyle Watkins, male    DOB: 1940/09/30, 79 y.o.   MRN: [id number]  HPI 11/2017 Patient was recently admitted to the hospital.  He was admitted after suffering a fecal impaction that led to dehydration and proctitis.  He was disimpacted in the office but then went to the hospital and his creatinine was found to be greater than 3.  Patient's creatinine at discharge was back down to 1.36.  His white blood cell count had fallen from greater than 20 on admission to just above 11 at discharge.  He is here today for follow-up.  He is accompanied  by his wife.  Patient is noncommunicative.  He is ambulating with a walker.  Wife states that he is doing very well.  She states that he is going to the bathroom and having a bowel movement every day as long as she gets a MiraLAX.  He is making a normal amount of urine.  Physical therapy was recommended at the hospital however the patient's wife declined after getting home because he is doing so well.  I watch the patient ambulate in the hallways here.  He is using a walker but he has a normal steady gait.  He shows excellent balance.  He can easily stand from a seated position and sit without wavering or staggering and only using his legs.  Therefore I agree with her that I am not certain that he needs physical therapy.  At that time, my plan was: I reviewed his hospital records and reconciled his medication list.  I will repeat a CMP to ensure that there is been complete total resolution of his prerenal azotemia.  I believe the patient had dehydration coupled with obstructive uropathy causing his acute kidney injury.  We will reassess his renal function today.  I believe his white blood cell count was secondary to Sirs, prostatitis, and proctitis.  He is completed Cipro and Flagyl.  I will repeat a CBC today to ensure resolution of his leukocytosis.  Regarding weakness and deconditioning, the patient's strength seems to be back to his  baseline.  He is ambulating well without much difficulty as long as he is using a walker.  Both the wife and I believe the patient does not seem to benefit from physical therapy at home and therefore I agree with her decision not to pursue this.  Unfortunately, due to his dementia, his prognosis remains guarded.  I anticipate that this will likely only worsen with time.  I recommended that he use MiraLAX every day to help prevent future fecal impactions.  Discontinue MiraLAX only if patient shows diarrhea  01/18/19 I have not seen the patient since May of last year.  He is here today with his family.  They are requesting a lift chair to facilitate their ability to care for the patient at home.  His dementia has progressed.  The patient is now having a difficult time even rising from a seated position.  This makes it extremely difficult to go to the restroom and to provide for his ADLs.  His wife, due to advancing age, is unable to pull him to a standing position from a seated position.  Therefore a lift chair would be greatly beneficial in helping her to help him stand in a safe manner preventing falls and injuries both to the patient and his wife.  I performed a stand up and go test on the patient.  He is noncommunicative.  When  I asked him to stand, he blankly stares at me.  When I start to pull gently on his arms he starts to stand up, he pauses mid stance, then tries to sit back down as if confused.  His wife states that he does this frequently.  He will try to pull on her to help pull himself to a stand.  She is essentially trying to lift dead weight.  They also need a handicap placard for the car.  They already have a shower chair and grab bars in the bathroom.  She does not need a wheelchair.  She does not need a hospital bed.  He is battling constipation.  At that time, my plan was: Patient would benefit from a lift chair.  His dementia has progressed to the point that he does not know how to help his  wife get him out of a chair.  He frequently pulls on her which causes her to lose her balance.  This going to cause her to fall or injure herself.  With a lift chair she could help him to a standing position so that he can go to bed or so that he can go to the bathroom.  Once standing, he is able to walk with a cane or a walker however he definitely needs a lift chair to help prevent unintended falls and hospitalizations  11/11/19 Dementia has progressed.  Patient is noncommunicative today and is unable to follow commands.  He has a difficult time even standing on the scale due to lack of comprehension about basic commands and instructions.  He is accompanied today by his daughter as well as his wife.  They provide excellent care for the patient and bathe him 2-3 times a week.  We discussed getting home health involved to help with bathing and they declined the assistance at the present time.  However they are retrofitting her bathtub.  At the present time the patient has to step over the side of the tub to get into the shower where he has a shower chair and a pull bar.  Unfortunately, he is unable to follow commands to step over on the side of the foam and has fallen on 2 separate occasions once hitting his head.  There retrofilling the shower to have a 0 clearance entrance to make it easier to get in and out of the bathroom.  Wife states that he is sleeping well.  She denies any delusions at night or hallucinations or wandering behavior or violent behavior.  He still only has 1-2 bowel movements a week.  She gives the patient Dulcolax as needed to facilitate these.  He is not taking MiraLAX every day.  However his wife is giving him a fiber supplement.  She denies any apparent pain.  Patient is unable to provide any history however she denies any appearance of discomfort on his part.  I have felt that the patient has Alzheimer's disease.  I ordered and MRI in 2017 that revealed: Brain: No reversible cause of  memory loss identified. No hydrocephalus, mass, specific atrophy, chronic hemorrhagic foci, or cortical diffusion abnormality. Moderate generalized cortical atrophy. Mesial temporal volume loss is congruent with generalized cortical atrophy. Microvascular ischemic change in the cerebral white matter is minimal for age, negative for vascular dementia. No acute infarct, hemorrhage, or major vessel occlusion.  However the family reports that the patient had a tremor for more than 15 years prior to me assuming his care in 2017.  They describe it  as a pill-rolling tremor.  He was unable to control it.  In fact at times it would keep him from being able to eat because his hand would shake when he would try to feed himself.  After the tremor, he developed speech difficulties and trouble communicating.  Around that time, I became patient's physician.  From the time that I have known him, he has been virtually noncommunicative.  He demonstrates end-stage dementia with lack of communication, inability to follow commands,.  He is quite rigid on exam today.  There is no cogwheel rigidity however.  The patient does demonstrate resistance in the upper extremities preventing me from passively flexing and extending his elbows or moving his shoulders.  I believe most of this is due to confusion and lack of understanding and insight as to me performing a physical exam.  I would still favor Alzheimer's disease rather than Parkinson's disease as a cause of his dementia based on his clinical appearance. Past Medical History:  Diagnosis Date  . Dementia [facility])    Past Surgical History:  Procedure Laterality Date  . Arm surgery     . MOUTH SURGERY    . SKIN GRAFT     Current Outpatient Medications on File Prior to Visit  Medication Sig Dispense Refill  . aspirin EC 81 MG tablet Take 81 mg by mouth daily.    . cholecalciferol (VITAMIN D) 1000 units tablet Take 1,000 Units by mouth daily.    . Cyanocobalamin (VITAMIN  B-12 PO) Take 1 tablet by mouth daily.    . diclofenac sodium (VOLTAREN) 1 % GEL APPLY 2 G TOPICALLY 4 TIMES DAILY. Requires office visit before any further refills can be given. 100 g 3  . donepezil (ARICEPT) 10 MG tablet TAKE 1 TABLET BY MOUTH AT BEDTIME 90 tablet 3  . loratadine (CLARITIN) 10 MG tablet Take 10 mg by mouth daily as needed for allergies.    . memantine (NAMENDA) 10 MG tablet TAKE 1 TABLET BY MOUTH TWICE A DAY 60 tablet 11  . ondansetron (ZOFRAN ODT) 4 MG disintegrating tablet Take 1 tablet (4 mg total) by mouth every 8 (eight) hours as needed for nausea or vomiting. 20 tablet 0  . polyethylene glycol (MIRALAX) packet Take 17 g by mouth daily as needed for mild constipation or moderate constipation (if no BM in 24 hours). 30 each 3  . senna-docusate (SENOKOT-S) 8.6-50 MG tablet Take 1 tablet by mouth 2 (two) times daily. 180 tablet 3   No current facility-administered medications on file prior to visit.   Allergies  Allergen Reactions  . Keflex [Cephalexin] Itching and Rash   Social History   Socioeconomic History  . Marital status: Married    Spouse name: Not on file  . Number of children: Not on file  . Years of education: Not on file  . Highest education level: Not on file  Occupational History  . Not on file  Tobacco Use  . Smoking status: Former Smoker    Types: Cigarettes    Quit date: 02/22/1959    Years since quitting: 60.7  . Smokeless tobacco: Never Used  Substance and Sexual Activity  . Alcohol use: No  . Drug use: No  . Sexual activity: Not on file  Other Topics Concern  . Not on file  Social History Narrative  . Not on file   Social Determinants of Health   Financial Resource Strain:   . Difficulty of Paying Living Expenses:   Food Insecurity:   .  Worried About Charity fundraiser in the Last Year:   . Arboriculturist in the Last Year:   Transportation Needs:   . Film/video editor (Medical):   Marland Kitchen Lack of Transportation (Non-Medical):    Physical Activity:   . Days of Exercise per Week:   . Minutes of Exercise per Session:   Stress:   . Feeling of Stress :   Social Connections:   . Frequency of Communication with Friends and Family:   . Frequency of Social Gatherings with Friends and Family:   . Attends Religious Services:   . Active Member of Clubs or Organizations:   . Attends Archivist Meetings:   Marland Kitchen Marital Status:   Intimate Partner Violence:   . Fear of Current or Ex-Partner:   . Emotionally Abused:   Marland Kitchen Physically Abused:   . Sexually Abused:      Review of Systems  All other systems reviewed and are negative.      Objective:   Physical Exam Vitals reviewed.  Constitutional:      General: He is not in acute distress.    Appearance: Normal appearance. He is well-developed and normal weight. He is not ill-appearing or toxic-appearing.  HENT:     Right Ear: Tympanic membrane and ear canal normal.     Left Ear: Tympanic membrane and ear canal normal.     Nose: Nose normal. No congestion or rhinorrhea.     Mouth/Throat:     Mouth: Mucous membranes are moist.     Pharynx: Oropharynx is clear. No oropharyngeal exudate or posterior oropharyngeal erythema.  Eyes:     Extraocular Movements: Extraocular movements intact.     Conjunctiva/sclera: Conjunctivae normal.     Pupils: Pupils are equal, round, and reactive to light.  Neck:     Thyroid: No thyromegaly.     Vascular: No JVD.  Cardiovascular:     Rate and Rhythm: Normal rate and regular rhythm.     Heart sounds: Normal heart sounds. No murmur. No friction rub. No gallop.   Pulmonary:     Effort: Pulmonary effort is normal. No respiratory distress.     Breath sounds: Normal breath sounds. No stridor. No wheezing, rhonchi or rales.  Abdominal:     General: Bowel sounds are normal. There is no distension.     Palpations: Abdomen is soft. There is no mass.     Tenderness: There is no abdominal tenderness. There is no guarding or rebound.   Musculoskeletal:     Right lower leg: No edema.     Left lower leg: No edema.  Lymphadenopathy:     Cervical: No cervical adenopathy.  Skin:    Coloration: Skin is not jaundiced.     Findings: No bruising, lesion or rash.  Neurological:     General: No focal deficit present.     Mental Status: He is disoriented.     Cranial Nerves: No cranial nerve deficit.     Sensory: No sensory deficit.     Motor: Weakness present.     Coordination: Coordination abnormal.     Gait: Gait abnormal.  Psychiatric:        Attention and Perception: He is inattentive.        Mood and Affect: Affect is flat.        Speech: He is noncommunicative.        Behavior: Behavior is cooperative.        Cognition and Memory: Memory is  impaired. He exhibits impaired recent memory and impaired remote memory.           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  He is already on Aricept and Namenda at the maximum doses.  Patient is now having a difficult time performing ADLs due to confusion.  He has fallen several times while taking a bath.  His family is having to retrofit their shower in order to help perform basic hygiene.  Patient is unable to complete depression screen as he is unable to answer any questions today.  He is a high fall risk.  He also has significant dementia.  Given his history of chronic kidney disease I will check a CBC and a CMP.  His blood pressure today is well controlled at 122/64.  Patient's immunizations are due.  He is due for COVID-19 vaccination.  I explained the rationale this to his daughter and his wife and strongly encouraged that.  He is also due for shingles vaccine.  I would not recommend prostate  cancer screening or colon cancer screening given his age and his frailty.

## 2019-11-12 LAB — CBC WITH DIFFERENTIAL/PLATELET
Absolute Monocytes: 589 cells/uL (ref 200–950)
Basophils Absolute: 64 cells/uL (ref 0–200)
Basophils Relative: 0.7 %
Eosinophils Absolute: 405 cells/uL (ref 15–500)
Eosinophils Relative: 4.4 %
HCT: 47.4 % (ref 38.5–50.0)
Hemoglobin: 16 g/dL (ref 13.2–17.1)
Lymphs Abs: 1831 cells/uL (ref 850–3900)
MCH: 29.6 pg (ref 27.0–33.0)
MCHC: 33.8 g/dL (ref 32.0–36.0)
MCV: 87.6 fL (ref 80.0–100.0)
MPV: 10.7 fL (ref 7.5–12.5)
Monocytes Relative: 6.4 %
Neutro Abs: 6311 cells/uL (ref 1500–7800)
Neutrophils Relative %: 68.6 %
Platelets: 305 10*3/uL (ref 140–400)
RBC: 5.41 10*6/uL (ref 4.20–5.80)
RDW: 13.3 % (ref 11.0–15.0)
Total Lymphocyte: 19.9 %
WBC: 9.2 10*3/uL (ref 3.8–10.8)

## 2019-11-12 LAB — COMPLETE METABOLIC PANEL WITH GFR
AG Ratio: 1.4 (calc) (ref 1.0–2.5)
ALT: 12 U/L (ref 9–46)
AST: 12 U/L (ref 10–35)
Albumin: 4 g/dL (ref 3.6–5.1)
Alkaline phosphatase (APISO): 57 U/L (ref 35–144)
BUN/Creatinine Ratio: 15 (calc) (ref 6–22)
BUN: 21 mg/dL (ref 7–25)
CO2: 28 mmol/L (ref 20–32)
Calcium: 9.3 mg/dL (ref 8.6–10.3)
Chloride: 107 mmol/L (ref 98–110)
Creat: 1.42 mg/dL — ABNORMAL HIGH (ref 0.70–1.11)
GFR, Est African American: 54 mL/min/{1.73_m2} — ABNORMAL LOW (ref >=60)
GFR, Est Non African American: 46 mL/min/{1.73_m2} — ABNORMAL LOW (ref >=60)
Globulin: 2.9 g/dL (calc) (ref 1.9–3.7)
Glucose, Bld: 106 mg/dL — ABNORMAL HIGH (ref 65–99)
Potassium: 4.1 mmol/L (ref 3.5–5.3)
Sodium: 146 mmol/L (ref 135–146)
Total Bilirubin: 0.3 mg/dL (ref 0.2–1.2)
Total Protein: 6.9 g/dL (ref 6.1–8.1)

## 2019-11-15 ENCOUNTER — Other Ambulatory Visit: Payer: Self-pay | Admitting: Family Medicine

## 2019-11-15 MED ORDER — METRONIDAZOLE 0.75 % EX CREA: TOPICAL_CREAM | Freq: Two times a day (BID) | CUTANEOUS | 11 refills | Status: DC

## 2020-01-03 ENCOUNTER — Other Ambulatory Visit: Payer: Self-pay | Admitting: Family Medicine

## 2020-11-20 ENCOUNTER — Telehealth: Payer: Self-pay | Admitting: Family Medicine

## 2020-11-20 NOTE — Telephone Encounter (Signed)
Copied from [place] [id number]. Topic: Medicare AWV >> Nov 20, 2020 12:15 PM Cher Nakai R wrote: Reason for CRM:  Left message for patient to call back and schedule Medicare Annual Wellness Visit (AWV) in office.   If not able to come in office, please offer to do virtually or by telephone.   Last AWV: 09/08/2017  Please schedule at anytime with [facility]-Nurse Health Advisor.  If any questions, please contact me at [phone]

## 2020-11-21 ENCOUNTER — Other Ambulatory Visit: Payer: Self-pay | Admitting: Family Medicine

## 2020-12-15 ENCOUNTER — Other Ambulatory Visit: Payer: Self-pay | Admitting: Family Medicine

## 2021-01-12 ENCOUNTER — Other Ambulatory Visit: Payer: Self-pay

## 2021-01-12 ENCOUNTER — Encounter: Payer: Self-pay | Admitting: Family Medicine

## 2021-01-12 ENCOUNTER — Ambulatory Visit (INDEPENDENT_AMBULATORY_CARE_PROVIDER_SITE_OTHER): Payer: Medicare HMO | Admitting: Family Medicine

## 2021-01-12 VITALS — BP 132/76 | HR 62 | Temp 97.9°F | Resp 14

## 2021-01-12 DIAGNOSIS — F0390 Unspecified dementia without behavioral disturbance: Secondary | ICD-10-CM | POA: Diagnosis not present

## 2021-01-12 DIAGNOSIS — Z0000 Encounter for general adult medical examination without abnormal findings: Secondary | ICD-10-CM

## 2021-01-12 DIAGNOSIS — G309 Alzheimer's disease, unspecified: Secondary | ICD-10-CM

## 2021-01-12 DIAGNOSIS — Z0001 Encounter for general adult medical examination with abnormal findings: Secondary | ICD-10-CM | POA: Diagnosis not present

## 2021-01-12 DIAGNOSIS — F0280 Dementia in other diseases classified elsewhere without behavioral disturbance: Secondary | ICD-10-CM | POA: Diagnosis not present

## 2021-01-12 NOTE — Progress Notes (Signed)
Subjective:    Patient ID: Kyle Watkins, male    DOB: 24-Jun-1941, 80 y.o.   MRN: [id number]  Patient has severe end-stage dementia.  He is noncommunicative.  It takes to family members even to bring him to the office.  He will not follow any commands.  He sits the majority of the day without speaking.  His wife states that she wakes up usually early in the mornings before him and will try to turn him on his side just to help him change position.  She then has to struggle to get him in an old wheelchair.  This is very difficult and requires 2 family members to come over to their home in order to do.  He will then sit the majority of the day in the wheelchair which she is unable to move from room to room because it is broken and has not maneuverable.  It is very difficult to get him into the bathroom or into the shower because the chair is malfunctioning.  Therefore it is difficult to perform any ADLs for the patient.  The wife is left giving him "bird baths" in bed.  They have a home that has been designed with a walk-in shower with no curb.  Therefore if she had a properly functioning wheelchair, she would be able to wheel him directly into the shower and then they can transfer him to a shower bench.  He is incontinent of urine and stool.  He has 1 bowel movement a week on average.  His wife tries to give him MiraLAX to get him to have a bowel movement every 3 days to avoid impaction.  He eats very little.  She also has a difficult time getting him into bed at night.  They have a standard bed and she is not strong enough to get him to stand up and get into the bed.  Sometimes he will slide off the edge of the bed onto the floor.  If she had a hospital bed that would allow positioning, it would be easier for her to get him in and out of the bed.  She often feeds him in bed when she is unable to get him out of the bed.  This also would benefit from a hospital bed because she would be able to raise his head  to 30 degrees in order to feed him in the bed therefore she would not have to struggle so much to get him into a wheelchair.  Therefore she is requesting a wheelchair and a hospital bed in an effort to keep him at home.  She refuses COVID vaccination.  In her words it is a conspiracy.  Therefore I did not belabor the situation as she is adamant not to receive the COVID vaccination.  He is also due for the shingles vaccine as well as a tetanus vaccine as well as a flu shot.  She does not want to give him any vaccinations.  She states that he would not want to live like this. Past Medical History:  Diagnosis Date   Dementia [facility])    Past Surgical History:  Procedure Laterality Date   Arm surgery      MOUTH SURGERY     SKIN GRAFT     Current Outpatient Medications on File Prior to Visit  Medication Sig Dispense Refill   aspirin EC 81 MG tablet Take 81 mg by mouth daily.     cholecalciferol (VITAMIN D) 1000 units tablet Take  1,000 Units by mouth daily.     donepezil (ARICEPT) 10 MG tablet TAKE 1 TABLET BY MOUTH EVERY NIGHT AT BEDTIME 90 tablet 0   linaclotide (LINZESS) 145 MCG CAPS capsule Take 1 capsule (145 mcg total) by mouth daily before breakfast. 30 capsule 3   loratadine (CLARITIN) 10 MG tablet Take 10 mg by mouth daily as needed for allergies.     memantine (NAMENDA) 10 MG tablet TAKE 1 TABLET BY MOUTH TWICE A DAY 60 tablet 11   polyethylene glycol (MIRALAX) packet Take 17 g by mouth daily as needed for mild constipation or moderate constipation (if no BM in 24 hours). 30 each 3   senna-docusate (SENOKOT-S) 8.6-50 MG tablet Take 1 tablet by mouth 2 (two) times daily. 180 tablet 3   No current facility-administered medications on file prior to visit.   Allergies  Allergen Reactions   Keflex [Cephalexin] Itching and Rash   Social History   Socioeconomic History   Marital status: Married    Spouse name: Not on file   Number of children: Not on file   Years of education: Not on file    Highest education level: Not on file  Occupational History   Not on file  Tobacco Use   Smoking status: Former    Pack years: 0.00    Types: Cigarettes    Quit date: 02/22/1959    Years since quitting: 61.9   Smokeless tobacco: Never  Substance and Sexual Activity   Alcohol use: No   Drug use: No   Sexual activity: Not on file  Other Topics Concern   Not on file  Social History Narrative   Not on file   Social Determinants of Health   Financial Resource Strain: Not on file  Food Insecurity: Not on file  Transportation Needs: Not on file  Physical Activity: Not on file  Stress: Not on file  Social Connections: Not on file  Intimate Partner Violence: Not on file   Family History  Problem Relation Age of Onset   Diabetes Mellitus II Neg Hx    CAD Neg Hx       Review of Systems  All other systems reviewed and are negative.     Objective:   Physical Exam Vitals reviewed.  Constitutional:      General: He is not in acute distress.    Appearance: He is well-developed. He is not diaphoretic.  HENT:     Head: Normocephalic and atraumatic.     Right Ear: External ear normal.     Left Ear: External ear normal.     Nose: Nose normal.     Mouth/Throat:     Pharynx: No oropharyngeal exudate.  Eyes:     General: No scleral icterus.       Right eye: No discharge.        Left eye: No discharge.     Conjunctiva/sclera: Conjunctivae normal.     Pupils: Pupils are equal, round, and reactive to light.  Neck:     Thyroid: No thyromegaly.     Vascular: No JVD.     Trachea: No tracheal deviation.  Cardiovascular:     Rate and Rhythm: Normal rate and regular rhythm.     Heart sounds: Normal heart sounds. No murmur heard.   No friction rub. No gallop.  Pulmonary:     Effort: Pulmonary effort is normal. No respiratory distress.     Breath sounds: Normal breath sounds. No stridor. No wheezing or rales.  Chest:     Chest wall: No tenderness.  Abdominal:     General:  Bowel sounds are normal. There is no distension.     Palpations: Abdomen is soft. There is no mass.     Tenderness: There is no abdominal tenderness. There is no guarding or rebound.  Musculoskeletal:        General: No tenderness or deformity. Normal range of motion.     Cervical back: Normal range of motion and neck supple.  Lymphadenopathy:     Cervical: No cervical adenopathy.  Neurological:     Mental Status: He is disoriented, confused and unresponsive.     Cranial Nerves: Cranial nerves are intact. No cranial nerve deficit.     Motor: Motor function is intact. No abnormal muscle tone.     Coordination: Coordination is intact.     Gait: Gait abnormal.  Psychiatric:        Attention and Perception: He is inattentive.        Mood and Affect: Affect is flat.        Speech: He is noncommunicative.        Behavior: Behavior is withdrawn.        Cognition and Memory: Cognition is impaired. Memory is impaired. He exhibits impaired recent memory and impaired remote memory.        Judgment: Judgment is not inappropriate.          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  He does not appear to require hospice at the present time.  I see no impending medical issues that would shorten his life expectancy less than 6 months.  However he would dramatically benefit from a hospital bed as well as a wheelchair and I will try to help facilitate this for the patient's family.  Check CBC and CMP.  Recommended COVID vaccination shingles vaccination and flu shot the wife is against vaccinations at the present time.

## 2021-01-13 LAB — CBC WITH DIFFERENTIAL/PLATELET
Absolute Monocytes: 628 cells/uL (ref 200–950)
Basophils Absolute: 48 cells/uL (ref 0–200)
Basophils Relative: 0.7 %
Eosinophils Absolute: 435 cells/uL (ref 15–500)
Eosinophils Relative: 6.3 %
HCT: 45.6 % (ref 38.5–50.0)
Hemoglobin: 15.4 g/dL (ref 13.2–17.1)
Lymphs Abs: 1732 cells/uL (ref 850–3900)
MCH: 30 pg (ref 27.0–33.0)
MCHC: 33.8 g/dL (ref 32.0–36.0)
MCV: 88.7 fL (ref 80.0–100.0)
MPV: 10.3 fL (ref 7.5–12.5)
Monocytes Relative: 9.1 %
Neutro Abs: 4057 cells/uL (ref 1500–7800)
Neutrophils Relative %: 58.8 %
Platelets: 250 10*3/uL (ref 140–400)
RBC: 5.14 10*6/uL (ref 4.20–5.80)
RDW: 12.9 % (ref 11.0–15.0)
Total Lymphocyte: 25.1 %
WBC: 6.9 10*3/uL (ref 3.8–10.8)

## 2021-01-13 LAB — COMPLETE METABOLIC PANEL WITH GFR
AG Ratio: 1.3 (calc) (ref 1.0–2.5)
ALT: 7 U/L — ABNORMAL LOW (ref 9–46)
AST: 9 U/L — ABNORMAL LOW (ref 10–35)
Albumin: 3.8 g/dL (ref 3.6–5.1)
Alkaline phosphatase (APISO): 44 U/L (ref 35–144)
BUN/Creatinine Ratio: 10 (calc) (ref 6–22)
BUN: 16 mg/dL (ref 7–25)
CO2: 28 mmol/L (ref 20–32)
Calcium: 8.8 mg/dL (ref 8.6–10.3)
Chloride: 104 mmol/L (ref 98–110)
Creat: 1.59 mg/dL — ABNORMAL HIGH (ref 0.70–1.11)
GFR, Est African American: 46 mL/min/{1.73_m2} — ABNORMAL LOW (ref >=60)
GFR, Est Non African American: 40 mL/min/{1.73_m2} — ABNORMAL LOW (ref >=60)
Globulin: 2.9 g/dL (calc) (ref 1.9–3.7)
Glucose, Bld: 114 mg/dL — ABNORMAL HIGH (ref 65–99)
Potassium: 3.7 mmol/L (ref 3.5–5.3)
Sodium: 140 mmol/L (ref 135–146)
Total Bilirubin: 0.5 mg/dL (ref 0.2–1.2)
Total Protein: 6.7 g/dL (ref 6.1–8.1)

## 2021-03-06 ENCOUNTER — Other Ambulatory Visit: Payer: Self-pay | Admitting: Family Medicine

## 2021-05-24 ENCOUNTER — Other Ambulatory Visit: Payer: Self-pay | Admitting: Family Medicine

## 2021-06-12 ENCOUNTER — Other Ambulatory Visit: Payer: Self-pay | Admitting: Family Medicine

## 2021-06-29 ENCOUNTER — Other Ambulatory Visit: Payer: Self-pay | Admitting: Family Medicine

## 2021-10-18 ENCOUNTER — Other Ambulatory Visit: Payer: Self-pay | Admitting: Family Medicine

## 2021-10-18 ENCOUNTER — Telehealth: Payer: Self-pay

## 2021-10-18 DIAGNOSIS — F02C0 Dementia in other diseases classified elsewhere, severe, without behavioral disturbance, psychotic disturbance, mood disturbance, and anxiety: Secondary | ICD-10-CM

## 2021-10-18 NOTE — Telephone Encounter (Signed)
Pt's daughter, June, called stating that there has been significant changes to pt's health and is requesting hospice care.  ? ?Do you want an appt? Please advice ?

## 2021-10-22 NOTE — Telephone Encounter (Signed)
Kyle Watkins with [facility] received a letter from pt's family to see if you agreed to be pt's attending physician's of care? ? ?Kyle Watkins at [phone] ?

## 2021-12-10 ENCOUNTER — Telehealth: Payer: Self-pay

## 2021-12-10 NOTE — Telephone Encounter (Signed)
[facility] called in asking for pcp to check [facility] for death certificate of this pt. ? ? ?

## 2021-12-27 DEATH — deceased
